# Patient Record
Sex: Female | Born: 1945 | Race: Black or African American | Hispanic: No | Marital: Single
Health system: Southern US, Community
[De-identification: ages and names within clinical notes are randomized; demographics above are authoritative.]

---

## 2004-07-12 ENCOUNTER — Emergency Department: Payer: Self-pay | Admitting: Emergency Medicine

## 2010-07-23 ENCOUNTER — Ambulatory Visit: Payer: Self-pay | Admitting: Oncology

## 2010-08-19 ENCOUNTER — Ambulatory Visit: Payer: Self-pay | Admitting: Oncology

## 2010-09-18 ENCOUNTER — Ambulatory Visit: Payer: Self-pay | Admitting: Oncology

## 2010-10-19 ENCOUNTER — Ambulatory Visit: Payer: Self-pay | Admitting: Oncology

## 2010-10-29 ENCOUNTER — Ambulatory Visit: Payer: Self-pay | Admitting: Internal Medicine

## 2010-11-13 ENCOUNTER — Ambulatory Visit: Payer: Self-pay | Admitting: Oncology

## 2010-11-18 ENCOUNTER — Ambulatory Visit: Payer: Self-pay | Admitting: Oncology

## 2010-12-19 ENCOUNTER — Ambulatory Visit: Payer: Self-pay | Admitting: Oncology

## 2011-01-19 ENCOUNTER — Ambulatory Visit: Payer: Self-pay | Admitting: Oncology

## 2011-02-18 ENCOUNTER — Ambulatory Visit: Payer: Self-pay | Admitting: Oncology

## 2011-03-21 ENCOUNTER — Ambulatory Visit: Payer: Self-pay | Admitting: Oncology

## 2011-04-20 ENCOUNTER — Ambulatory Visit: Payer: Self-pay | Admitting: Oncology

## 2011-06-25 ENCOUNTER — Ambulatory Visit: Payer: Self-pay | Admitting: Internal Medicine

## 2011-08-01 ENCOUNTER — Ambulatory Visit: Payer: Self-pay | Admitting: Oncology

## 2011-08-01 LAB — CBC CANCER CENTER
Basophil %: 0 %
Comment - H1-Com2: NORMAL
Eosinophil %: 1.3 %
HCT: 38.2 % (ref 35.0–47.0)
HGB: 12.5 g/dL (ref 12.0–16.0)
MCH: 31.2 pg (ref 26.0–34.0)
MCV: 95 fL (ref 80–100)
Monocyte #: 0.7 x10 3/mm (ref 0.0–0.7)
Neutrophil %: 48.9 %
RBC: 4.01 10*6/uL (ref 3.80–5.20)
Segmented Neutrophils: 50 %

## 2011-08-01 LAB — BASIC METABOLIC PANEL
Anion Gap: 11 (ref 7–16)
BUN: 44 mg/dL — ABNORMAL HIGH (ref 7–18)
Calcium, Total: 8.9 mg/dL (ref 8.5–10.1)
Chloride: 106 mmol/L (ref 98–107)
EGFR (African American): 24 — ABNORMAL LOW
Glucose: 86 mg/dL (ref 65–99)
Osmolality: 295 (ref 275–301)
Potassium: 4.4 mmol/L (ref 3.5–5.1)

## 2011-08-05 LAB — KAPPA/LAMBDA FREE LIGHT CHAINS (ARMC)

## 2011-08-05 LAB — PROT IMMUNOELECTROPHORES(ARMC)

## 2011-08-19 ENCOUNTER — Ambulatory Visit: Payer: Self-pay | Admitting: Oncology

## 2011-10-01 ENCOUNTER — Ambulatory Visit: Payer: Self-pay | Admitting: Oncology

## 2011-10-01 LAB — CBC CANCER CENTER
Basophil #: 0 x10 3/mm (ref 0.0–0.1)
HGB: 12.1 g/dL (ref 12.0–16.0)
Lymphocyte #: 1.5 x10 3/mm (ref 1.0–3.6)
Lymphocyte %: 36.6 %
MCH: 30.5 pg (ref 26.0–34.0)
MCV: 98 fL (ref 80–100)
Monocyte #: 0.5 x10 3/mm (ref 0.2–0.9)
Monocyte %: 11.3 %
Platelet: 170 x10 3/mm (ref 150–440)
RBC: 3.96 10*6/uL (ref 3.80–5.20)
WBC: 4.1 x10 3/mm (ref 3.6–11.0)

## 2011-10-01 LAB — BASIC METABOLIC PANEL
Anion Gap: 11 (ref 7–16)
BUN: 51 mg/dL — ABNORMAL HIGH (ref 7–18)
Calcium, Total: 9.2 mg/dL (ref 8.5–10.1)
Chloride: 106 mmol/L (ref 98–107)
Creatinine: 2.35 mg/dL — ABNORMAL HIGH (ref 0.60–1.30)
EGFR (Non-African Amer.): 21 — ABNORMAL LOW
Glucose: 100 mg/dL — ABNORMAL HIGH (ref 65–99)
Osmolality: 297 (ref 275–301)
Potassium: 4.5 mmol/L (ref 3.5–5.1)
Sodium: 142 mmol/L (ref 136–145)

## 2011-10-19 ENCOUNTER — Ambulatory Visit: Payer: Self-pay | Admitting: Oncology

## 2011-11-25 ENCOUNTER — Ambulatory Visit: Payer: Self-pay | Admitting: Vascular Surgery

## 2011-11-25 LAB — BASIC METABOLIC PANEL
Anion Gap: 11 (ref 7–16)
BUN: 47 mg/dL — ABNORMAL HIGH (ref 7–18)
Co2: 23 mmol/L (ref 21–32)
Creatinine: 2.38 mg/dL — ABNORMAL HIGH (ref 0.60–1.30)
Glucose: 90 mg/dL (ref 65–99)
Sodium: 144 mmol/L (ref 136–145)

## 2011-11-25 LAB — CBC
HCT: 35.9 % (ref 35.0–47.0)
HGB: 11.2 g/dL — ABNORMAL LOW (ref 12.0–16.0)
WBC: 3.4 10*3/uL — ABNORMAL LOW (ref 3.6–11.0)

## 2011-12-04 ENCOUNTER — Ambulatory Visit: Payer: Self-pay | Admitting: Vascular Surgery

## 2012-01-16 ENCOUNTER — Ambulatory Visit: Payer: Self-pay | Admitting: Oncology

## 2012-01-16 LAB — CBC CANCER CENTER
Basophil #: 0 x10 3/mm (ref 0.0–0.1)
Eosinophil #: 0.1 x10 3/mm (ref 0.0–0.7)
HCT: 35.9 % (ref 35.0–47.0)
HGB: 11.2 g/dL — ABNORMAL LOW (ref 12.0–16.0)
Lymphocyte #: 1.9 x10 3/mm (ref 1.0–3.6)
Lymphocyte %: 36.5 %
Monocyte %: 10.9 %
Neutrophil #: 2.7 x10 3/mm (ref 1.4–6.5)
Neutrophil %: 51.3 %
RBC: 3.66 10*6/uL — ABNORMAL LOW (ref 3.80–5.20)
WBC: 5.2 x10 3/mm (ref 3.6–11.0)

## 2012-01-16 LAB — BASIC METABOLIC PANEL
Anion Gap: 12 (ref 7–16)
BUN: 56 mg/dL — ABNORMAL HIGH (ref 7–18)
EGFR (Non-African Amer.): 15 — ABNORMAL LOW
Glucose: 119 mg/dL — ABNORMAL HIGH (ref 65–99)
Osmolality: 305 (ref 275–301)
Potassium: 4.6 mmol/L (ref 3.5–5.1)
Sodium: 145 mmol/L (ref 136–145)

## 2012-01-17 LAB — PROT IMMUNOELECTROPHORES(ARMC)

## 2012-01-17 LAB — KAPPA/LAMBDA FREE LIGHT CHAINS (ARMC)

## 2012-01-19 ENCOUNTER — Ambulatory Visit: Payer: Self-pay | Admitting: Oncology

## 2012-03-16 LAB — URINALYSIS, COMPLETE
Bilirubin,UR: NEGATIVE
Glucose,UR: NEGATIVE mg/dL
Ketone: NEGATIVE
Leukocyte Esterase: NEGATIVE
Nitrite: NEGATIVE
Ph: 5
Protein: NEGATIVE
RBC,UR: 3 /HPF
Specific Gravity: 1.008
Squamous Epithelial: <1
WBC UR: 1 /HPF

## 2012-03-16 LAB — LIPASE, BLOOD: Lipase: 351 U/L (ref 73–393)

## 2012-03-17 ENCOUNTER — Ambulatory Visit: Payer: Self-pay | Admitting: Oncology

## 2012-03-17 LAB — CBC WITH DIFFERENTIAL/PLATELET
Basophil #: 0 10*3/uL (ref 0.0–0.1)
Basophil %: 0.3 %
Eosinophil #: 0 10*3/uL (ref 0.0–0.7)
HGB: 8.2 g/dL — ABNORMAL LOW (ref 12.0–16.0)
MCH: 30.8 pg (ref 26.0–34.0)
MCHC: 32.1 g/dL (ref 32.0–36.0)
MCV: 96 fL (ref 80–100)
Monocyte #: 0.7 x10 3/mm (ref 0.2–0.9)
Monocyte %: 12 %
Neutrophil %: 56.4 %
Platelet: 83 10*3/uL — ABNORMAL LOW (ref 150–440)

## 2012-03-17 LAB — COMPREHENSIVE METABOLIC PANEL
Albumin: 2.6 g/dL — ABNORMAL LOW (ref 3.4–5.0)
Alkaline Phosphatase: 40 U/L — ABNORMAL LOW (ref 50–136)
Chloride: 108 mmol/L — ABNORMAL HIGH (ref 98–107)
Creatinine: 9.13 mg/dL — ABNORMAL HIGH (ref 0.60–1.30)
EGFR (African American): 5 — ABNORMAL LOW
Potassium: 5.1 mmol/L (ref 3.5–5.1)
SGOT(AST): 14 U/L — ABNORMAL LOW (ref 15–37)
SGPT (ALT): 10 U/L — ABNORMAL LOW (ref 12–78)
Sodium: 140 mmol/L (ref 136–145)
Total Protein: 8.7 g/dL — ABNORMAL HIGH (ref 6.4–8.2)

## 2012-03-17 LAB — PHOSPHORUS: Phosphorus: 4.7 mg/dL (ref 2.5–4.9)

## 2012-03-17 LAB — MAGNESIUM: Magnesium: 1.9 mg/dL

## 2012-03-18 LAB — IRON AND TIBC
Iron: 50 ug/dL (ref 50–170)
Unbound Iron-Bind.Cap.: 92 ug/dL

## 2012-03-18 LAB — BASIC METABOLIC PANEL
Anion Gap: 13 (ref 7–16)
BUN: 77 mg/dL — ABNORMAL HIGH (ref 7–18)
Chloride: 105 mmol/L (ref 98–107)
Creatinine: 7.01 mg/dL — ABNORMAL HIGH (ref 0.60–1.30)
EGFR (African American): 6 — ABNORMAL LOW
EGFR (Non-African Amer.): 6 — ABNORMAL LOW
Osmolality: 303 (ref 275–301)

## 2012-03-19 ENCOUNTER — Inpatient Hospital Stay: Payer: Self-pay | Admitting: Internal Medicine

## 2012-03-19 LAB — PHOSPHORUS: Phosphorus: 3.3 mg/dL (ref 2.5–4.9)

## 2012-03-20 ENCOUNTER — Ambulatory Visit: Payer: Self-pay | Admitting: Oncology

## 2012-03-21 LAB — PHOSPHORUS: Phosphorus: 3.7 mg/dL (ref 2.5–4.9)

## 2012-03-23 ENCOUNTER — Ambulatory Visit: Payer: Self-pay | Admitting: Oncology

## 2012-03-23 LAB — CBC CANCER CENTER
Basophil #: 0 x10 3/mm (ref 0.0–0.1)
Lymphocyte #: 1.5 x10 3/mm (ref 1.0–3.6)
Lymphocyte %: 26.6 %
MCV: 99 fL (ref 80–100)
Monocyte #: 0.5 x10 3/mm (ref 0.2–0.9)
Monocyte %: 8.1 %
Neutrophil #: 3.5 x10 3/mm (ref 1.4–6.5)
Neutrophil %: 63.6 %
Platelet: 57 x10 3/mm — ABNORMAL LOW (ref 150–440)
RDW: 15.7 % — ABNORMAL HIGH (ref 11.5–14.5)
WBC: 5.6 x10 3/mm (ref 3.6–11.0)

## 2012-03-28 ENCOUNTER — Emergency Department: Payer: Self-pay | Admitting: Emergency Medicine

## 2012-03-28 LAB — CBC WITH DIFFERENTIAL/PLATELET
Basophil #: 0 10*3/uL (ref 0.0–0.1)
Basophil %: 0.4 %
Eosinophil #: 0 10*3/uL (ref 0.0–0.7)
HCT: 23 % — ABNORMAL LOW (ref 35.0–47.0)
Lymphocyte #: 0.7 10*3/uL — ABNORMAL LOW (ref 1.0–3.6)
MCHC: 32.8 g/dL (ref 32.0–36.0)
MCV: 97 fL (ref 80–100)
Monocyte #: 0.7 x10 3/mm (ref 0.2–0.9)
Monocyte %: 15.4 %
Neutrophil #: 3.1 10*3/uL (ref 1.4–6.5)
Platelet: 43 10*3/uL — ABNORMAL LOW (ref 150–440)
RBC: 2.36 10*6/uL — ABNORMAL LOW (ref 3.80–5.20)
RDW: 15.6 % — ABNORMAL HIGH (ref 11.5–14.5)
WBC: 4.5 10*3/uL (ref 3.6–11.0)

## 2012-03-28 LAB — COMPREHENSIVE METABOLIC PANEL
Albumin: 2.6 g/dL — ABNORMAL LOW (ref 3.4–5.0)
Anion Gap: 11 (ref 7–16)
BUN: 19 mg/dL — ABNORMAL HIGH (ref 7–18)
Bilirubin,Total: 0.5 mg/dL (ref 0.2–1.0)
Chloride: 98 mmol/L (ref 98–107)
Co2: 27 mmol/L (ref 21–32)
Creatinine: 4.48 mg/dL — ABNORMAL HIGH (ref 0.60–1.30)
EGFR (African American): 11 — ABNORMAL LOW
EGFR (Non-African Amer.): 10 — ABNORMAL LOW
Osmolality: 275 (ref 275–301)
Potassium: 3 mmol/L — ABNORMAL LOW (ref 3.5–5.1)
SGPT (ALT): 12 U/L (ref 12–78)
Sodium: 136 mmol/L (ref 136–145)
Total Protein: 8.7 g/dL — ABNORMAL HIGH (ref 6.4–8.2)

## 2012-03-30 LAB — CBC CANCER CENTER
Basophil %: 0.8 %
Eosinophil #: 0 x10 3/mm (ref 0.0–0.7)
HCT: 23.8 % — ABNORMAL LOW (ref 35.0–47.0)
HGB: 7.3 g/dL — ABNORMAL LOW (ref 12.0–16.0)
Lymphocyte #: 1.2 x10 3/mm (ref 1.0–3.6)
Lymphocyte %: 27.7 %
MCH: 30.5 pg (ref 26.0–34.0)
Monocyte #: 0.3 x10 3/mm (ref 0.2–0.9)
Monocyte %: 7 %
Neutrophil %: 64.1 %
Platelet: 44 x10 3/mm — ABNORMAL LOW (ref 150–440)
RDW: 15.8 % — ABNORMAL HIGH (ref 11.5–14.5)
WBC: 4.5 x10 3/mm (ref 3.6–11.0)

## 2012-04-02 ENCOUNTER — Emergency Department: Payer: Self-pay | Admitting: Emergency Medicine

## 2012-04-02 LAB — CBC
HCT: 25.3 % — ABNORMAL LOW (ref 35.0–47.0)
HGB: 8.2 g/dL — ABNORMAL LOW (ref 12.0–16.0)
MCH: 31.7 pg (ref 26.0–34.0)
MCHC: 32.6 g/dL (ref 32.0–36.0)
Platelet: 55 10*3/uL — ABNORMAL LOW (ref 150–440)

## 2012-04-02 LAB — COMPREHENSIVE METABOLIC PANEL
Albumin: 2.6 g/dL — ABNORMAL LOW (ref 3.4–5.0)
BUN: 64 mg/dL — ABNORMAL HIGH (ref 7–18)
Bilirubin,Total: 0.3 mg/dL (ref 0.2–1.0)
Calcium, Total: 8.8 mg/dL (ref 8.5–10.1)
Chloride: 96 mmol/L — ABNORMAL LOW (ref 98–107)
Creatinine: 8.98 mg/dL — ABNORMAL HIGH (ref 0.60–1.30)
EGFR (African American): 5 — ABNORMAL LOW
Glucose: 76 mg/dL (ref 65–99)
Potassium: 3.5 mmol/L (ref 3.5–5.1)
SGOT(AST): 25 U/L (ref 15–37)
SGPT (ALT): 12 U/L (ref 12–78)
Total Protein: 8.4 g/dL — ABNORMAL HIGH (ref 6.4–8.2)

## 2012-04-03 LAB — CULTURE, BLOOD (SINGLE)

## 2012-04-13 LAB — COMPREHENSIVE METABOLIC PANEL
Albumin: 2.6 g/dL — ABNORMAL LOW (ref 3.4–5.0)
Alkaline Phosphatase: 69 U/L (ref 50–136)
BUN: 12 mg/dL (ref 7–18)
Bilirubin,Total: 0.4 mg/dL (ref 0.2–1.0)
Calcium, Total: 8.7 mg/dL (ref 8.5–10.1)
Chloride: 97 mmol/L — ABNORMAL LOW (ref 98–107)
Osmolality: 283 (ref 275–301)
Potassium: 3.6 mmol/L (ref 3.5–5.1)
SGPT (ALT): 22 U/L (ref 12–78)
Total Protein: 7.7 g/dL (ref 6.4–8.2)

## 2012-04-13 LAB — CBC CANCER CENTER
Basophil #: 0 x10 3/mm (ref 0.0–0.1)
Basophil %: 0.4 %
Eosinophil #: 0 x10 3/mm (ref 0.0–0.7)
HCT: 21.6 % — ABNORMAL LOW (ref 35.0–47.0)
Lymphocyte #: 1.2 x10 3/mm (ref 1.0–3.6)
Lymphocyte %: 20.2 %
MCHC: 31.1 g/dL — ABNORMAL LOW (ref 32.0–36.0)
Neutrophil #: 4 x10 3/mm (ref 1.4–6.5)
Neutrophil %: 69.1 %
RDW: 17.2 % — ABNORMAL HIGH (ref 11.5–14.5)

## 2012-04-19 ENCOUNTER — Ambulatory Visit: Payer: Self-pay | Admitting: Oncology

## 2012-04-30 LAB — BASIC METABOLIC PANEL
Anion Gap: 7 (ref 7–16)
BUN: 15 mg/dL (ref 7–18)
Calcium, Total: 8.5 mg/dL (ref 8.5–10.1)
Creatinine: 5.14 mg/dL — ABNORMAL HIGH (ref 0.60–1.30)
EGFR (African American): 9 — ABNORMAL LOW
EGFR (Non-African Amer.): 8 — ABNORMAL LOW
Glucose: 86 mg/dL (ref 65–99)
Sodium: 139 mmol/L (ref 136–145)

## 2012-04-30 LAB — CBC CANCER CENTER
Basophil #: 0 x10 3/mm (ref 0.0–0.1)
Basophil %: 0.5 %
HCT: 23.5 % — ABNORMAL LOW (ref 35.0–47.0)
HGB: 7.3 g/dL — ABNORMAL LOW (ref 12.0–16.0)
Lymphocyte %: 19.3 %
Monocyte %: 18 %
Neutrophil #: 2.8 x10 3/mm (ref 1.4–6.5)
Neutrophil %: 61.5 %
RBC: 2.3 10*6/uL — ABNORMAL LOW (ref 3.80–5.20)
WBC: 4.6 x10 3/mm (ref 3.6–11.0)

## 2012-05-02 LAB — PROT IMMUNOELECTROPHORES(ARMC)

## 2012-05-04 LAB — BASIC METABOLIC PANEL
Anion Gap: 20 — ABNORMAL HIGH (ref 7–16)
Calcium, Total: 8.1 mg/dL — ABNORMAL LOW (ref 8.5–10.1)
Chloride: 97 mmol/L — ABNORMAL LOW (ref 98–107)
Co2: 23 mmol/L (ref 21–32)
EGFR (Non-African Amer.): 3 — ABNORMAL LOW
Potassium: 3.3 mmol/L — ABNORMAL LOW (ref 3.5–5.1)
Sodium: 140 mmol/L (ref 136–145)

## 2012-05-04 LAB — CBC CANCER CENTER
Basophil #: 0 x10 3/mm (ref 0.0–0.1)
Basophil %: 0.3 %
HGB: 9 g/dL — ABNORMAL LOW (ref 12.0–16.0)
Lymphocyte %: 16.1 %
MCH: 31.9 pg (ref 26.0–34.0)
MCHC: 32.3 g/dL (ref 32.0–36.0)
Monocyte %: 13.3 %
Neutrophil #: 5.1 x10 3/mm (ref 1.4–6.5)
Neutrophil %: 69.8 %
Platelet: 134 x10 3/mm — ABNORMAL LOW (ref 150–440)
WBC: 7.3 x10 3/mm (ref 3.6–11.0)

## 2012-05-20 ENCOUNTER — Ambulatory Visit: Payer: Self-pay | Admitting: Oncology

## 2012-05-25 LAB — CBC CANCER CENTER
Basophil #: 0.1 x10 3/mm (ref 0.0–0.1)
Basophil %: 2 %
HCT: 32.1 % — ABNORMAL LOW (ref 35.0–47.0)
HGB: 10 g/dL — ABNORMAL LOW (ref 12.0–16.0)
Lymphocyte %: 18.8 %
MCHC: 31.3 g/dL — ABNORMAL LOW (ref 32.0–36.0)
MCV: 100 fL (ref 80–100)
Monocyte #: 0.4 x10 3/mm (ref 0.2–0.9)
Monocyte %: 9.5 %
Neutrophil %: 68.8 %
Platelet: 98 x10 3/mm — ABNORMAL LOW (ref 150–440)
RBC: 3.2 10*6/uL — ABNORMAL LOW (ref 3.80–5.20)
RDW: 21.6 % — ABNORMAL HIGH (ref 11.5–14.5)

## 2012-05-25 LAB — BASIC METABOLIC PANEL
Anion Gap: 12 (ref 7–16)
BUN: 50 mg/dL — ABNORMAL HIGH (ref 7–18)
Calcium, Total: 7.5 mg/dL — ABNORMAL LOW (ref 8.5–10.1)
Chloride: 103 mmol/L (ref 98–107)
Co2: 31 mmol/L (ref 21–32)
Creatinine: 8.17 mg/dL — ABNORMAL HIGH (ref 0.60–1.30)
Glucose: 144 mg/dL — ABNORMAL HIGH (ref 65–99)
Osmolality: 306 (ref 275–301)
Potassium: 3.9 mmol/L (ref 3.5–5.1)

## 2012-06-15 LAB — CBC CANCER CENTER
Basophil #: 0 x10 3/mm (ref 0.0–0.1)
Eosinophil #: 0 x10 3/mm (ref 0.0–0.7)
Eosinophil %: 0 %
Lymphocyte %: 12.5 %
MCHC: 30.9 g/dL — ABNORMAL LOW (ref 32.0–36.0)
MCV: 100 fL (ref 80–100)
Neutrophil #: 5 x10 3/mm (ref 1.4–6.5)
Platelet: 97 x10 3/mm — ABNORMAL LOW (ref 150–440)

## 2012-06-15 LAB — BASIC METABOLIC PANEL
BUN: 47 mg/dL — ABNORMAL HIGH (ref 7–18)
Chloride: 101 mmol/L (ref 98–107)
Creatinine: 7.81 mg/dL — ABNORMAL HIGH (ref 0.60–1.30)
EGFR (Non-African Amer.): 5 — ABNORMAL LOW
Osmolality: 303 (ref 275–301)
Potassium: 3.5 mmol/L (ref 3.5–5.1)
Sodium: 145 mmol/L (ref 136–145)

## 2012-06-16 LAB — PROT IMMUNOELECTROPHORES(ARMC)

## 2012-06-20 ENCOUNTER — Ambulatory Visit: Payer: Self-pay | Admitting: Oncology

## 2012-06-21 ENCOUNTER — Inpatient Hospital Stay: Payer: Self-pay | Admitting: Internal Medicine

## 2012-06-21 LAB — CK TOTAL AND CKMB (NOT AT ARMC)
CK, Total: 75 U/L (ref 21–215)
CK, Total: 62 U/L (ref 21–215)
CK-MB: 1.1 ng/mL (ref 0.5–3.6)
CK-MB: 1.1 ng/mL (ref 0.5–3.6)

## 2012-06-21 LAB — COMPREHENSIVE METABOLIC PANEL
Alkaline Phosphatase: 104 U/L (ref 50–136)
Anion Gap: 14 (ref 7–16)
BUN: 29 mg/dL — ABNORMAL HIGH (ref 7–18)
Chloride: 98 mmol/L (ref 98–107)
Co2: 26 mmol/L (ref 21–32)
Creatinine: 5.52 mg/dL — ABNORMAL HIGH (ref 0.60–1.30)
EGFR (Non-African Amer.): 7 — ABNORMAL LOW
Osmolality: 281 (ref 275–301)
Potassium: 3.3 mmol/L — ABNORMAL LOW (ref 3.5–5.1)
SGOT(AST): 17 U/L (ref 15–37)
SGPT (ALT): 10 U/L — ABNORMAL LOW (ref 12–78)
Sodium: 138 mmol/L (ref 136–145)
Total Protein: 7 g/dL (ref 6.4–8.2)

## 2012-06-21 LAB — CBC WITH DIFFERENTIAL/PLATELET
Basophil #: 0 10*3/uL (ref 0.0–0.1)
Eosinophil #: 0 10*3/uL (ref 0.0–0.7)
HCT: 39.3 % (ref 35.0–47.0)
Lymphocyte #: 0.8 10*3/uL — ABNORMAL LOW (ref 1.0–3.6)
MCH: 31.2 pg (ref 26.0–34.0)
MCHC: 31 g/dL — ABNORMAL LOW (ref 32.0–36.0)
MCV: 101 fL — ABNORMAL HIGH (ref 80–100)
Monocyte #: 1.1 x10 3/mm — ABNORMAL HIGH (ref 0.2–0.9)
Monocyte %: 12.4 %
Neutrophil #: 6.7 10*3/uL — ABNORMAL HIGH (ref 1.4–6.5)
Neutrophil %: 77.3 %
Platelet: 74 10*3/uL — ABNORMAL LOW (ref 150–440)
RBC: 3.9 10*6/uL (ref 3.80–5.20)
RDW: 20.4 % — ABNORMAL HIGH (ref 11.5–14.5)

## 2012-06-21 LAB — RAPID INFLUENZA A&B ANTIGENS

## 2012-06-21 LAB — TROPONIN I: Troponin-I: 0.11 ng/mL — ABNORMAL HIGH

## 2012-06-22 LAB — CBC WITH DIFFERENTIAL/PLATELET
Basophil #: 0 10*3/uL (ref 0.0–0.1)
Basophil %: 0.2 %
Eosinophil #: 0 10*3/uL (ref 0.0–0.7)
Eosinophil %: 0 %
HCT: 37.4 % (ref 35.0–47.0)
Lymphocyte #: 0.4 10*3/uL — ABNORMAL LOW (ref 1.0–3.6)
Lymphocyte %: 5 %
MCHC: 31.3 g/dL — ABNORMAL LOW (ref 32.0–36.0)
Monocyte #: 0.3 x10 3/mm (ref 0.2–0.9)
Monocyte %: 3.5 %
Neutrophil #: 8.1 10*3/uL — ABNORMAL HIGH (ref 1.4–6.5)
Neutrophil %: 91.3 %
Platelet: 83 10*3/uL — ABNORMAL LOW (ref 150–440)
RDW: 20.5 % — ABNORMAL HIGH (ref 11.5–14.5)
WBC: 8.9 10*3/uL (ref 3.6–11.0)

## 2012-06-22 LAB — BASIC METABOLIC PANEL
Anion Gap: 15 (ref 7–16)
Calcium, Total: 7.2 mg/dL — ABNORMAL LOW (ref 8.5–10.1)
Chloride: 98 mmol/L (ref 98–107)
Co2: 24 mmol/L (ref 21–32)
Creatinine: 7.2 mg/dL — ABNORMAL HIGH (ref 0.60–1.30)
EGFR (African American): 6 — ABNORMAL LOW
Potassium: 3.6 mmol/L (ref 3.5–5.1)
Sodium: 137 mmol/L (ref 136–145)

## 2012-06-25 ENCOUNTER — Ambulatory Visit: Payer: Self-pay | Admitting: Oncology

## 2012-07-18 ENCOUNTER — Ambulatory Visit: Payer: Self-pay | Admitting: Oncology

## 2012-07-23 LAB — BASIC METABOLIC PANEL
BUN: 20 mg/dL — ABNORMAL HIGH (ref 7–18)
Chloride: 99 mmol/L (ref 98–107)
Co2: 33 mmol/L — ABNORMAL HIGH (ref 21–32)
Creatinine: 4.64 mg/dL — ABNORMAL HIGH (ref 0.60–1.30)
Glucose: 108 mg/dL — ABNORMAL HIGH (ref 65–99)
Potassium: 3.3 mmol/L — ABNORMAL LOW (ref 3.5–5.1)

## 2012-07-23 LAB — CBC CANCER CENTER
Eosinophil #: 0 x10 3/mm (ref 0.0–0.7)
HCT: 34.9 % — ABNORMAL LOW (ref 35.0–47.0)
Lymphocyte #: 1 x10 3/mm (ref 1.0–3.6)
MCHC: 31.3 g/dL — ABNORMAL LOW (ref 32.0–36.0)
MCV: 96 fL (ref 80–100)
Monocyte #: 0.8 x10 3/mm (ref 0.2–0.9)
Monocyte %: 17.7 %
Neutrophil #: 2.5 x10 3/mm (ref 1.4–6.5)
Neutrophil %: 57.4 %
Platelet: 101 x10 3/mm — ABNORMAL LOW (ref 150–440)
RBC: 3.65 10*6/uL — ABNORMAL LOW (ref 3.80–5.20)
RDW: 19 % — ABNORMAL HIGH (ref 11.5–14.5)
WBC: 4.3 x10 3/mm (ref 3.6–11.0)

## 2012-08-18 ENCOUNTER — Ambulatory Visit: Payer: Self-pay | Admitting: Oncology

## 2012-09-03 LAB — CBC CANCER CENTER
Basophil #: 0.1 x10 3/mm (ref 0.0–0.1)
Eosinophil #: 0 x10 3/mm (ref 0.0–0.7)
Lymphocyte #: 1.1 x10 3/mm (ref 1.0–3.6)
MCH: 29.8 pg (ref 26.0–34.0)
MCV: 95 fL (ref 80–100)
Monocyte #: 1 x10 3/mm — ABNORMAL HIGH (ref 0.2–0.9)
Monocyte %: 16.2 %
Neutrophil #: 3.7 x10 3/mm (ref 1.4–6.5)
Neutrophil %: 62.8 %
Platelet: 110 x10 3/mm — ABNORMAL LOW (ref 150–440)
RDW: 17.9 % — ABNORMAL HIGH (ref 11.5–14.5)

## 2012-09-03 LAB — BASIC METABOLIC PANEL
Anion Gap: 10 (ref 7–16)
BUN: 8 mg/dL (ref 7–18)
Calcium, Total: 8.9 mg/dL (ref 8.5–10.1)
Chloride: 98 mmol/L (ref 98–107)
Co2: 32 mmol/L (ref 21–32)
EGFR (African American): 17 — ABNORMAL LOW
Glucose: 87 mg/dL (ref 65–99)
Sodium: 140 mmol/L (ref 136–145)

## 2012-09-07 LAB — PROT IMMUNOELECTROPHORES(ARMC)

## 2012-09-14 ENCOUNTER — Ambulatory Visit: Payer: Self-pay | Admitting: Vascular Surgery

## 2012-09-17 ENCOUNTER — Ambulatory Visit: Payer: Self-pay | Admitting: Oncology

## 2012-10-04 LAB — COMPREHENSIVE METABOLIC PANEL
Alkaline Phosphatase: 57 U/L (ref 50–136)
Chloride: 103 mmol/L (ref 98–107)
Co2: 26 mmol/L (ref 21–32)
Creatinine: 9.89 mg/dL — ABNORMAL HIGH (ref 0.60–1.30)
EGFR (African American): 4 — ABNORMAL LOW
EGFR (Non-African Amer.): 4 — ABNORMAL LOW
Glucose: 86 mg/dL (ref 65–99)
Osmolality: 287 (ref 275–301)
SGOT(AST): 30 U/L (ref 15–37)
Total Protein: 9.2 g/dL — ABNORMAL HIGH (ref 6.4–8.2)

## 2012-10-04 LAB — CBC
HGB: 9 g/dL — ABNORMAL LOW (ref 12.0–16.0)
RBC: 2.96 10*6/uL — ABNORMAL LOW (ref 3.80–5.20)
RDW: 17.9 % — ABNORMAL HIGH (ref 11.5–14.5)
WBC: 7.1 10*3/uL (ref 3.6–11.0)

## 2012-10-05 ENCOUNTER — Inpatient Hospital Stay: Payer: Self-pay | Admitting: Internal Medicine

## 2012-10-05 LAB — PHOSPHORUS: Phosphorus: 3.3 mg/dL (ref 2.5–4.9)

## 2012-10-05 LAB — TROPONIN I: Troponin-I: 0.25 ng/mL — ABNORMAL HIGH

## 2012-10-06 LAB — BASIC METABOLIC PANEL
BUN: 22 mg/dL — ABNORMAL HIGH (ref 7–18)
Chloride: 100 mmol/L (ref 98–107)
Creatinine: 6.6 mg/dL — ABNORMAL HIGH (ref 0.60–1.30)
Osmolality: 280 (ref 275–301)
Potassium: 3.7 mmol/L (ref 3.5–5.1)
Sodium: 139 mmol/L (ref 136–145)

## 2012-10-06 LAB — PROTEIN ELECTROPHORESIS(ARMC)

## 2012-10-10 LAB — CULTURE, BLOOD (SINGLE)

## 2012-10-14 ENCOUNTER — Inpatient Hospital Stay: Payer: Self-pay | Admitting: Internal Medicine

## 2012-10-14 LAB — PROTIME-INR
INR: 1.3
Prothrombin Time: 15.8 secs — ABNORMAL HIGH (ref 11.5–14.7)

## 2012-10-14 LAB — CBC
HGB: 8.9 g/dL — ABNORMAL LOW (ref 12.0–16.0)
MCH: 30.5 pg (ref 26.0–34.0)
MCHC: 32.3 g/dL (ref 32.0–36.0)
MCV: 94 fL (ref 80–100)
Platelet: 86 10*3/uL — ABNORMAL LOW (ref 150–440)
RBC: 2.92 10*6/uL — ABNORMAL LOW (ref 3.80–5.20)
WBC: 7.6 10*3/uL (ref 3.6–11.0)

## 2012-10-14 LAB — COMPREHENSIVE METABOLIC PANEL
Albumin: 2.3 g/dL — ABNORMAL LOW (ref 3.4–5.0)
Alkaline Phosphatase: 42 U/L — ABNORMAL LOW (ref 50–136)
Anion Gap: 11 (ref 7–16)
Calcium, Total: 10.2 mg/dL — ABNORMAL HIGH (ref 8.5–10.1)
Chloride: 99 mmol/L (ref 98–107)
Co2: 28 mmol/L (ref 21–32)
Creatinine: 4.68 mg/dL — ABNORMAL HIGH (ref 0.60–1.30)
EGFR (Non-African Amer.): 9 — ABNORMAL LOW
Glucose: 110 mg/dL — ABNORMAL HIGH (ref 65–99)
Osmolality: 276 (ref 275–301)
SGOT(AST): 29 U/L (ref 15–37)
SGPT (ALT): 7 U/L — ABNORMAL LOW (ref 12–78)

## 2012-10-15 LAB — CBC WITH DIFFERENTIAL/PLATELET
Basophil #: 0 10*3/uL (ref 0.0–0.1)
Basophil %: 0.4 %
Eosinophil #: 0 10*3/uL (ref 0.0–0.7)
Eosinophil %: 0.2 %
HCT: 24 % — ABNORMAL LOW (ref 35.0–47.0)
HGB: 8 g/dL — ABNORMAL LOW (ref 12.0–16.0)
Lymphocyte %: 15.6 %
MCHC: 33.3 g/dL (ref 32.0–36.0)
Monocyte %: 21.2 %
Neutrophil %: 62.6 %
Platelet: 77 10*3/uL — ABNORMAL LOW (ref 150–440)
RDW: 17.7 % — ABNORMAL HIGH (ref 11.5–14.5)

## 2012-10-15 LAB — BASIC METABOLIC PANEL
Anion Gap: 9 (ref 7–16)
Calcium, Total: 10.1 mg/dL (ref 8.5–10.1)
Co2: 30 mmol/L (ref 21–32)
Creatinine: 5.88 mg/dL — ABNORMAL HIGH (ref 0.60–1.30)
Glucose: 99 mg/dL (ref 65–99)
Sodium: 135 mmol/L — ABNORMAL LOW (ref 136–145)

## 2012-10-15 LAB — MAGNESIUM: Magnesium: 1.8 mg/dL

## 2012-10-15 LAB — VANCOMYCIN, TROUGH: Vancomycin, Trough: 33 ug/mL (ref 10–20)

## 2012-10-16 LAB — CBC WITH DIFFERENTIAL/PLATELET
Basophil #: 0 10*3/uL (ref 0.0–0.1)
Basophil %: 0.2 %
Eosinophil #: 0 10*3/uL (ref 0.0–0.7)
Eosinophil %: 0.5 %
HCT: 24.7 % — ABNORMAL LOW (ref 35.0–47.0)
HGB: 8.1 g/dL — ABNORMAL LOW (ref 12.0–16.0)
Lymphocyte #: 1 10*3/uL (ref 1.0–3.6)
Lymphocyte %: 15 %
MCH: 31.2 pg (ref 26.0–34.0)
MCHC: 32.8 g/dL (ref 32.0–36.0)
MCV: 95 fL (ref 80–100)
Monocyte #: 1.5 x10 3/mm — ABNORMAL HIGH (ref 0.2–0.9)
Neutrophil #: 4.2 10*3/uL (ref 1.4–6.5)
RBC: 2.6 10*6/uL — ABNORMAL LOW (ref 3.80–5.20)
WBC: 6.8 10*3/uL (ref 3.6–11.0)

## 2012-10-16 LAB — BASIC METABOLIC PANEL
Anion Gap: 7 (ref 7–16)
Calcium, Total: 10.2 mg/dL — ABNORMAL HIGH (ref 8.5–10.1)
EGFR (African American): 10 — ABNORMAL LOW
EGFR (Non-African Amer.): 9 — ABNORMAL LOW
Glucose: 94 mg/dL (ref 65–99)
Osmolality: 277 (ref 275–301)
Potassium: 3.6 mmol/L (ref 3.5–5.1)
Sodium: 138 mmol/L (ref 136–145)

## 2012-10-18 ENCOUNTER — Ambulatory Visit: Payer: Self-pay | Admitting: Oncology

## 2012-10-18 LAB — CBC WITH DIFFERENTIAL/PLATELET
HCT: 27.6 % — ABNORMAL LOW (ref 35.0–47.0)
HGB: 8.7 g/dL — ABNORMAL LOW (ref 12.0–16.0)
Monocyte #: 1.5 x10 3/mm — ABNORMAL HIGH (ref 0.2–0.9)
Monocyte %: 14.9 %
Platelet: 96 10*3/uL — ABNORMAL LOW (ref 150–440)
RBC: 2.83 10*6/uL — ABNORMAL LOW (ref 3.80–5.20)
WBC: 9.9 10*3/uL (ref 3.6–11.0)

## 2012-10-19 LAB — PHOSPHORUS: Phosphorus: 7.7 mg/dL — ABNORMAL HIGH (ref 2.5–4.9)

## 2012-10-19 LAB — PROTIME-INR
INR: 1.2
Prothrombin Time: 15.3 secs — ABNORMAL HIGH (ref 11.5–14.7)

## 2012-10-19 LAB — APTT: Activated PTT: 36.2 secs — ABNORMAL HIGH (ref 23.6–35.9)

## 2012-10-20 LAB — GLUCOSE, SEROUS FLUID: Glucose, Body Fluid: 62 mg/dL

## 2012-10-20 LAB — CULTURE, BLOOD (SINGLE)

## 2012-10-20 LAB — BODY FLUID CELL COUNT WITH DIFFERENTIAL
Basophil: 0 %
Eosinophil: 0 %
Nucleated Cell Count: 4317 /mm3
Other Cells BF: 93 %
Other Mononuclear Cells: 0 %

## 2012-10-20 LAB — PROTEIN, BODY FLUID: Protein, Body Fluid: 6.2 g/dL

## 2012-10-23 LAB — PHOSPHORUS: Phosphorus: 4 mg/dL (ref 2.5–4.9)

## 2012-10-24 ENCOUNTER — Ambulatory Visit: Payer: Self-pay | Admitting: Oncology

## 2012-10-24 LAB — BODY FLUID CULTURE

## 2012-11-04 ENCOUNTER — Observation Stay: Payer: Self-pay | Admitting: Internal Medicine

## 2012-11-04 LAB — BASIC METABOLIC PANEL
Anion Gap: 10 (ref 7–16)
BUN: 12 mg/dL (ref 7–18)
Chloride: 95 mmol/L — ABNORMAL LOW (ref 98–107)
Co2: 30 mmol/L (ref 21–32)
Creatinine: 3.54 mg/dL — ABNORMAL HIGH (ref 0.60–1.30)
EGFR (African American): 15 — ABNORMAL LOW
Glucose: 85 mg/dL (ref 65–99)
Osmolality: 269 (ref 275–301)
Sodium: 135 mmol/L — ABNORMAL LOW (ref 136–145)

## 2012-11-04 LAB — CBC WITH DIFFERENTIAL/PLATELET
Basophil #: 0 10*3/uL (ref 0.0–0.1)
Basophil %: 0.4 %
Eosinophil %: 1 %
HCT: 20.1 % — ABNORMAL LOW (ref 35.0–47.0)
Lymphocyte #: 1.6 10*3/uL (ref 1.0–3.6)
MCHC: 32.7 g/dL (ref 32.0–36.0)
MCV: 94 fL (ref 80–100)
Monocyte #: 1.1 x10 3/mm — ABNORMAL HIGH (ref 0.2–0.9)
Monocyte %: 20.1 %

## 2012-11-17 ENCOUNTER — Ambulatory Visit: Payer: Self-pay | Admitting: Oncology

## 2012-11-17 DEATH — deceased

## 2013-08-28 IMAGING — CR RIGHT HIP - COMPLETE 2+ VIEW
1 series · 2 of 2 positions shown · non-contrast
Comparison: none

REASON FOR EXAM: hip pain
COMMENTS:

PROCEDURE:     DXR - DXR HIP RIGHT COMPLETE  - June 25, 2011  [DATE]
RESULT:     Two views of the right hip were obtained. No fracture,
dislocation or other acute bony abnormality is identified. No lytic or
blastic lesions are seen.

[Series 1: t hip ap right · 0.14mm/px · 2 of 2 slices shown]
[im 1/2]
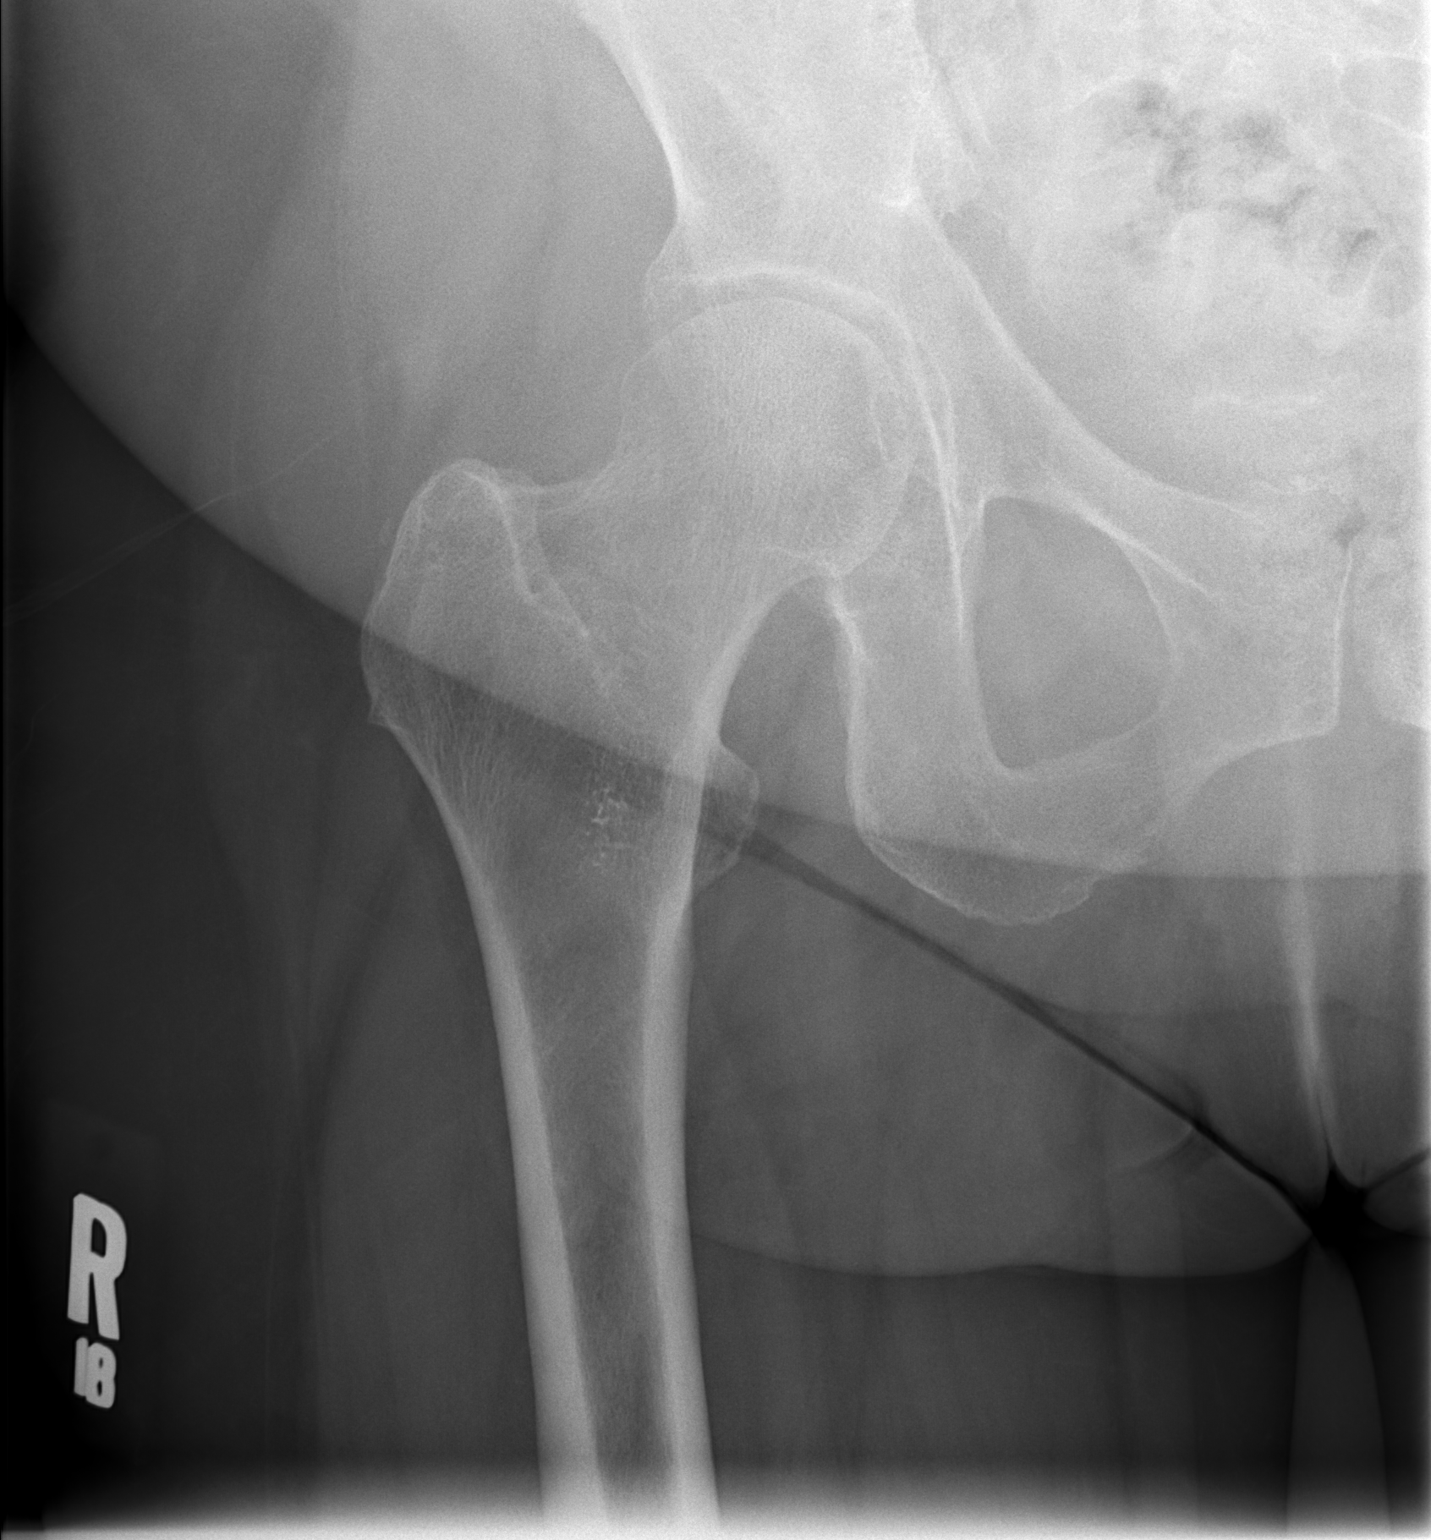
[im 2/2]
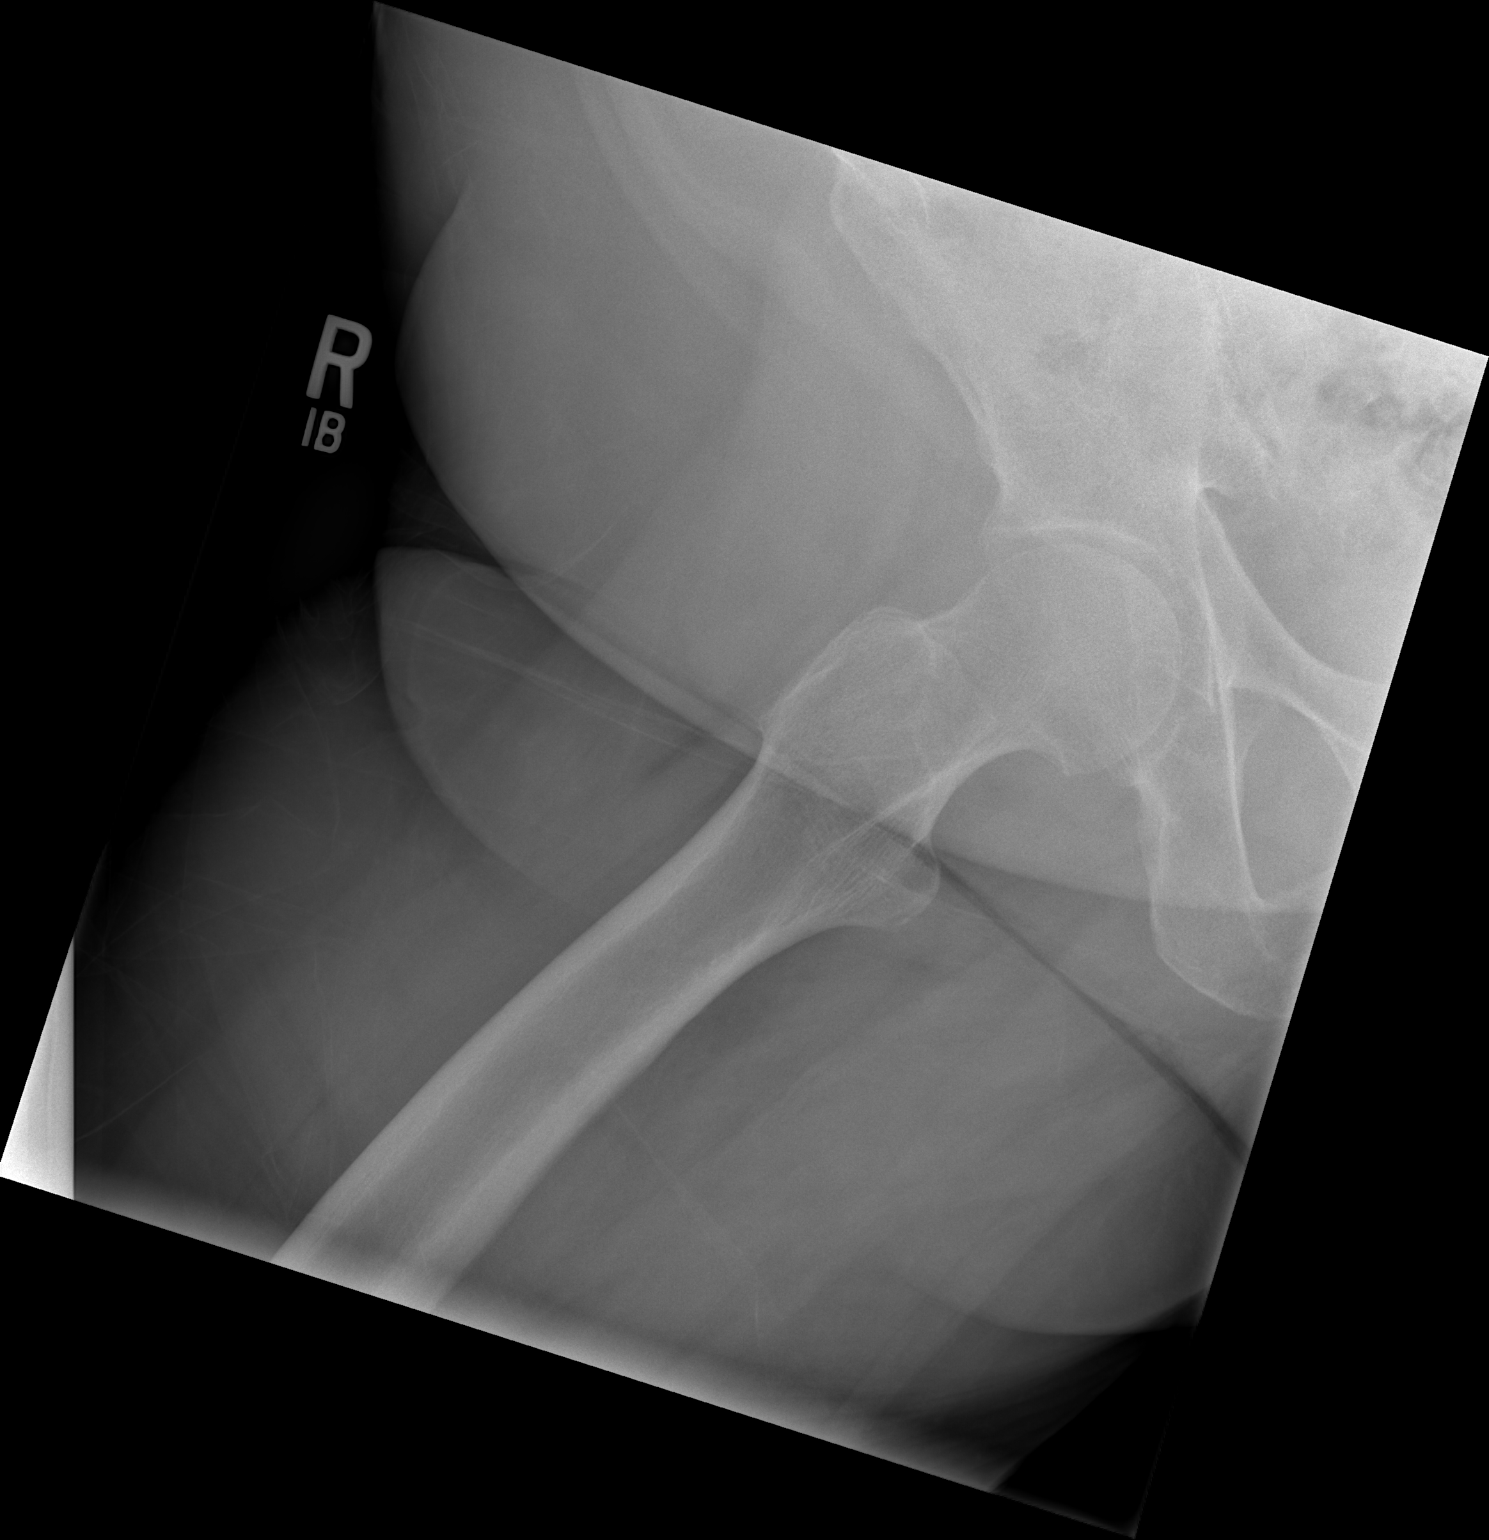

[2 of 2 positions shown; findings below may reference images not displayed]

IMPRESSION: 1.     No acute changes are identified.

## 2014-06-06 IMAGING — CT CT STONE STUDY
1 of 2 series · 15 of 32 positions shown, 19 images · non-contrast
Comparison: None

REASON FOR EXAM: right flank pain
COMMENTS:

PROCEDURE:     CT  - CT ABDOMEN /PELVIS WO (STONE)  - April 02, 2012  [DATE]
RESULT:     Indication: Flank Pain
TECHNIQUE: Multiple axial images from the lung bases to the symphysis pubis
were obtained without oral and without intravenous contrast.

[Series 2: 3mm soft tissue · axial · 0.76mm/px · z∈[-376,-38]mm · 15 of 123 slices shown, 19 images]
[im 5/123  soft-tissue]
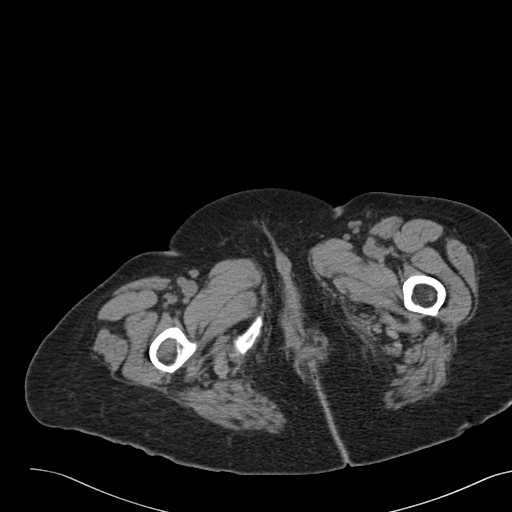
[im 5/123  bone]
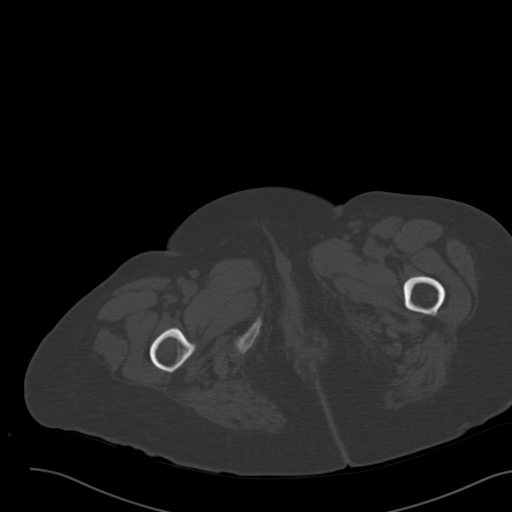
[im 15/123  soft-tissue]
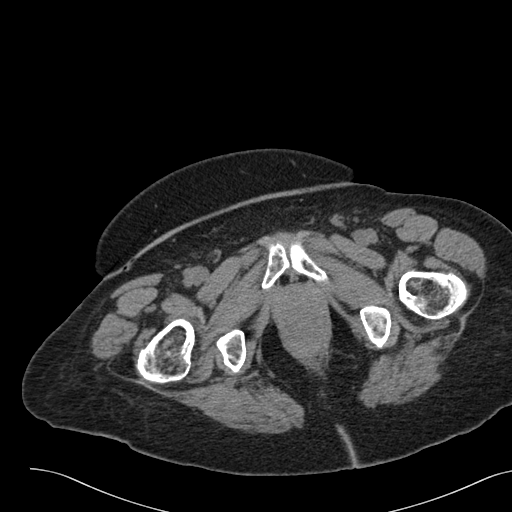
[im 25/123  soft-tissue]
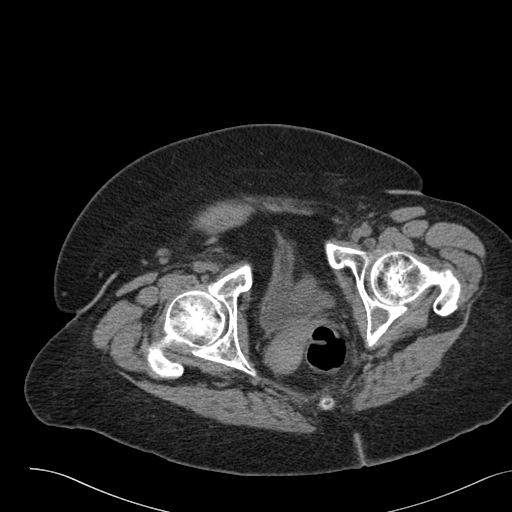
[im 35/123  soft-tissue]
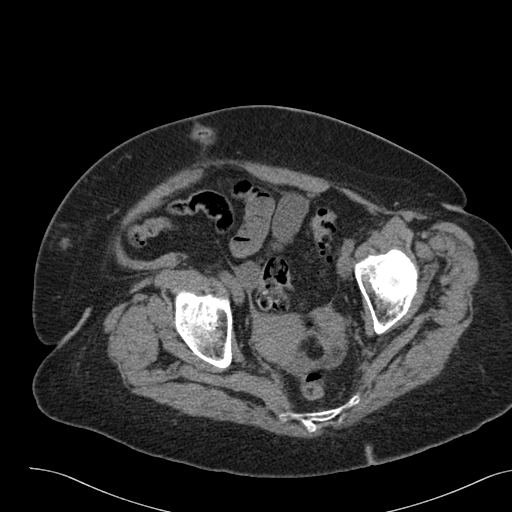
[im 44/123  soft-tissue]
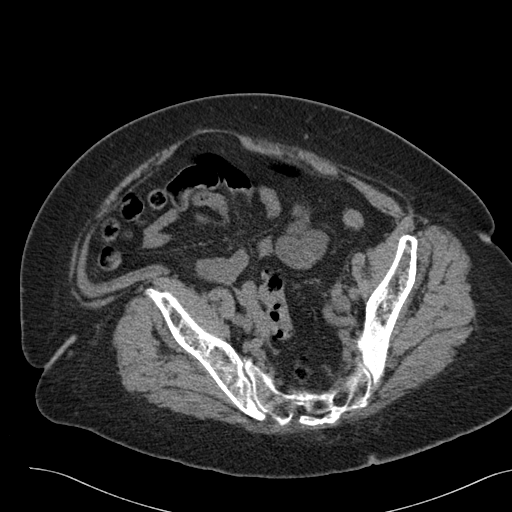
[im 54/123  soft-tissue]
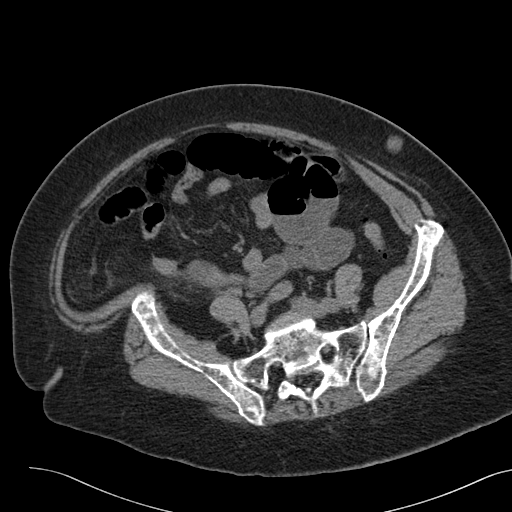
[im 64/123  soft-tissue]
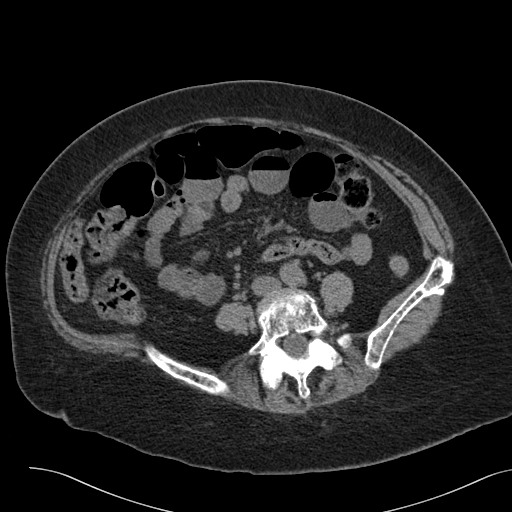
[im 69/123  soft-tissue]
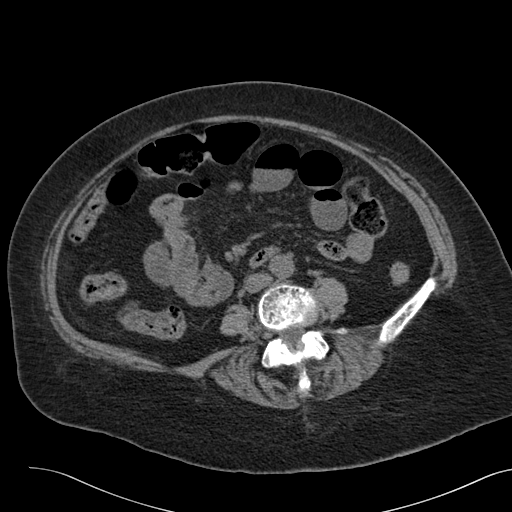
[im 79/123  soft-tissue]
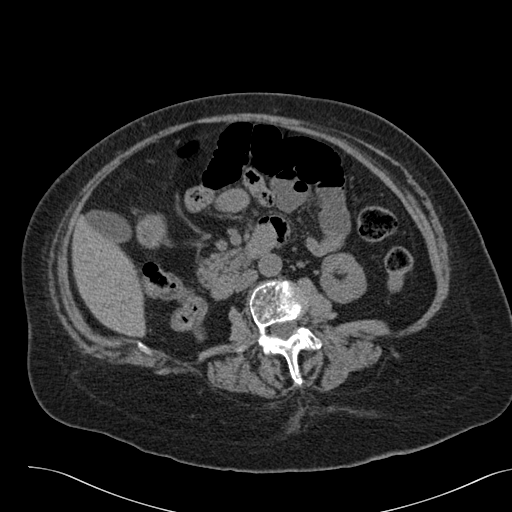
[im 79/123  bone]
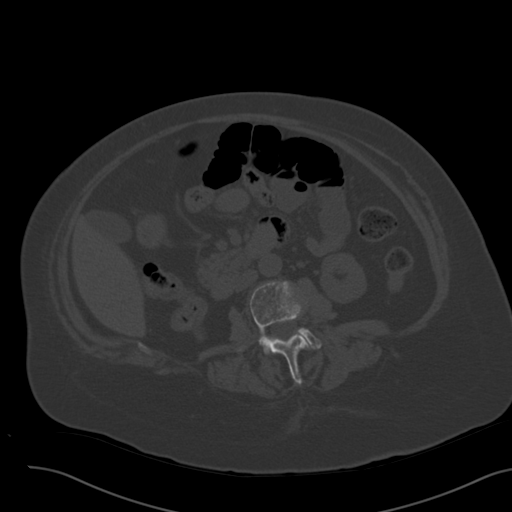
[im 88/123  soft-tissue]
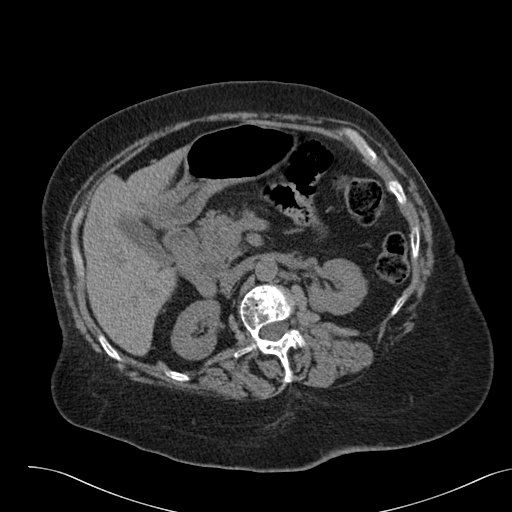
[im 98/123  soft-tissue]
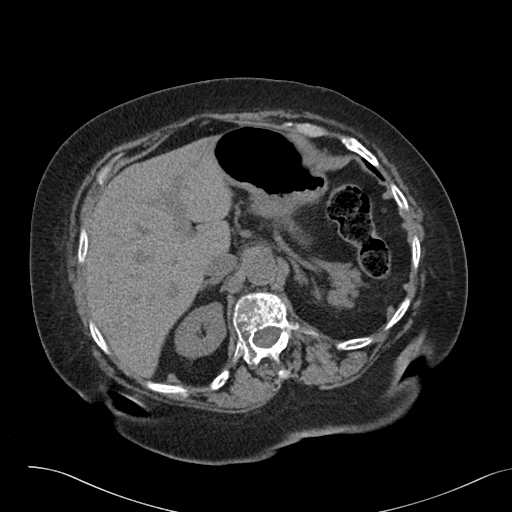
[im 103/123  lung]
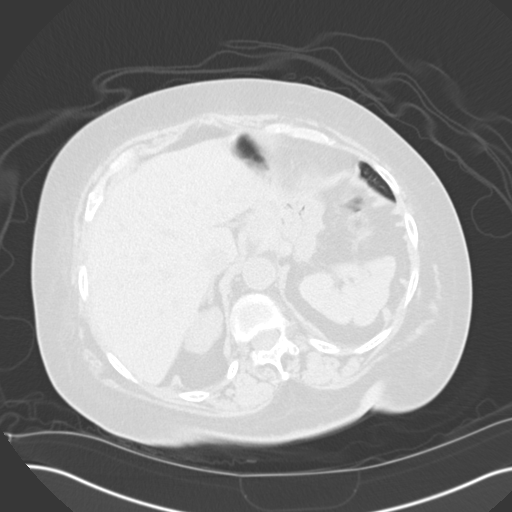
[im 108/123  soft-tissue]
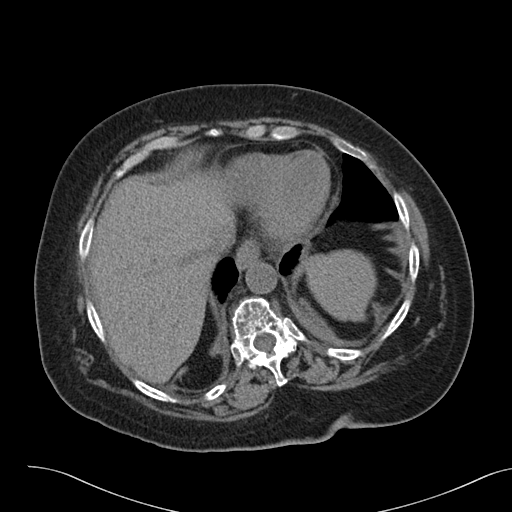
[im 108/123  lung]
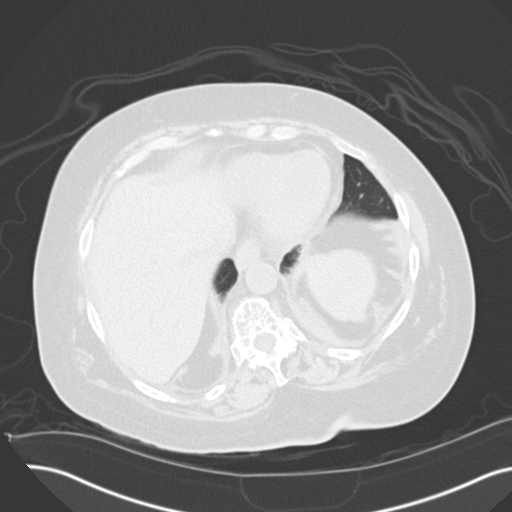
[im 113/123  lung]
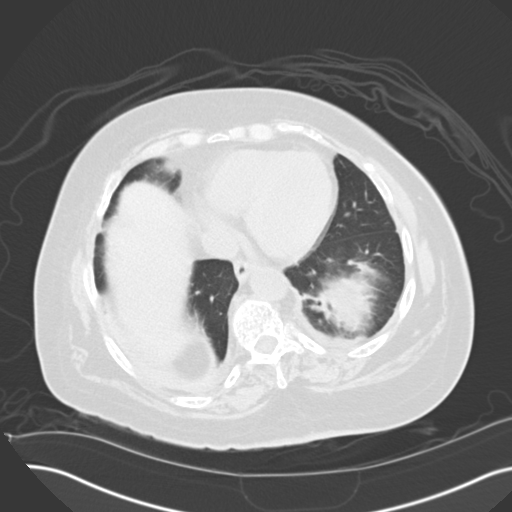
[im 118/123  soft-tissue]
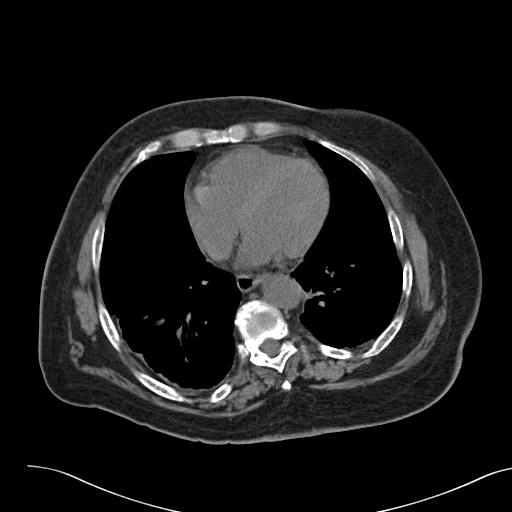
[im 118/123  lung]
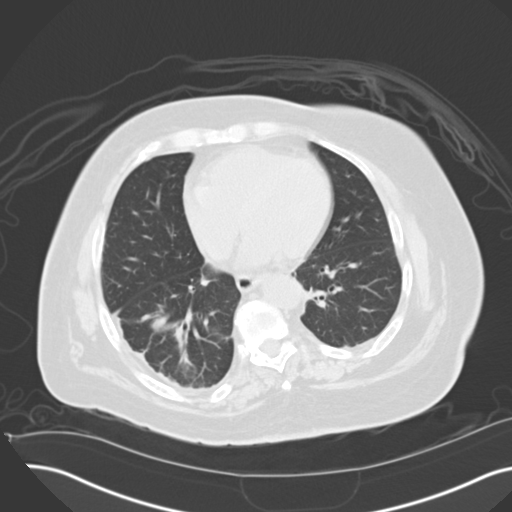

[15 of 32 positions shown; findings below may reference images not displayed]

FINDINGS: The lung bases are clear. There is no pleural or pericardial effusions.

No renal, ureteral, or bladder calculi. No obstructive uropathy. No
perinephric stranding is seen. The kidneys are symmetric in size without
evidence for exophytic mass. The bladder is unremarkable.

The liver demonstrates no focal abnormality. The gallbladder is
unremarkable. The spleen demonstrates no focal abnormality. The adrenal
glands and pancreas are normal.

There is a fat containing umbilical hernia. The unopacified stomach,
duodenum, small intestine, and large intestine are unremarkable, but
evaluation is limited by lack of oral contrast. There is diverticulosis
without evidence of diverticulitis. There is no pneumoperitoneum,
pneumatosis, or portal venous gas. There is no abdominal or pelvic free
fluid. There is no lymphadenopathy.

The abdominal aorta is normal in caliber with atherosclerosis.

The osseous structures are unremarkable.
IMPRESSION: 1. No urolithiasis or obstructive uropathy.

[REDACTED]

## 2014-06-06 IMAGING — CR DG LUMBAR SPINE 2-3V
1 series · 3 of 3 positions shown · non-contrast
Comparison: none

REASON FOR EXAM: lower back pain
COMMENTS:

PROCEDURE:     DXR - DXR LUMBAR SPINE AP AND LATERAL  - April 02, 2012  [DATE]
RESULT:     Comparison: Bone survey 10/15/2010

[Series 3: w lumbar spine ap · 0.14mm/px · 3 of 3 slices shown]
[im 1/3]
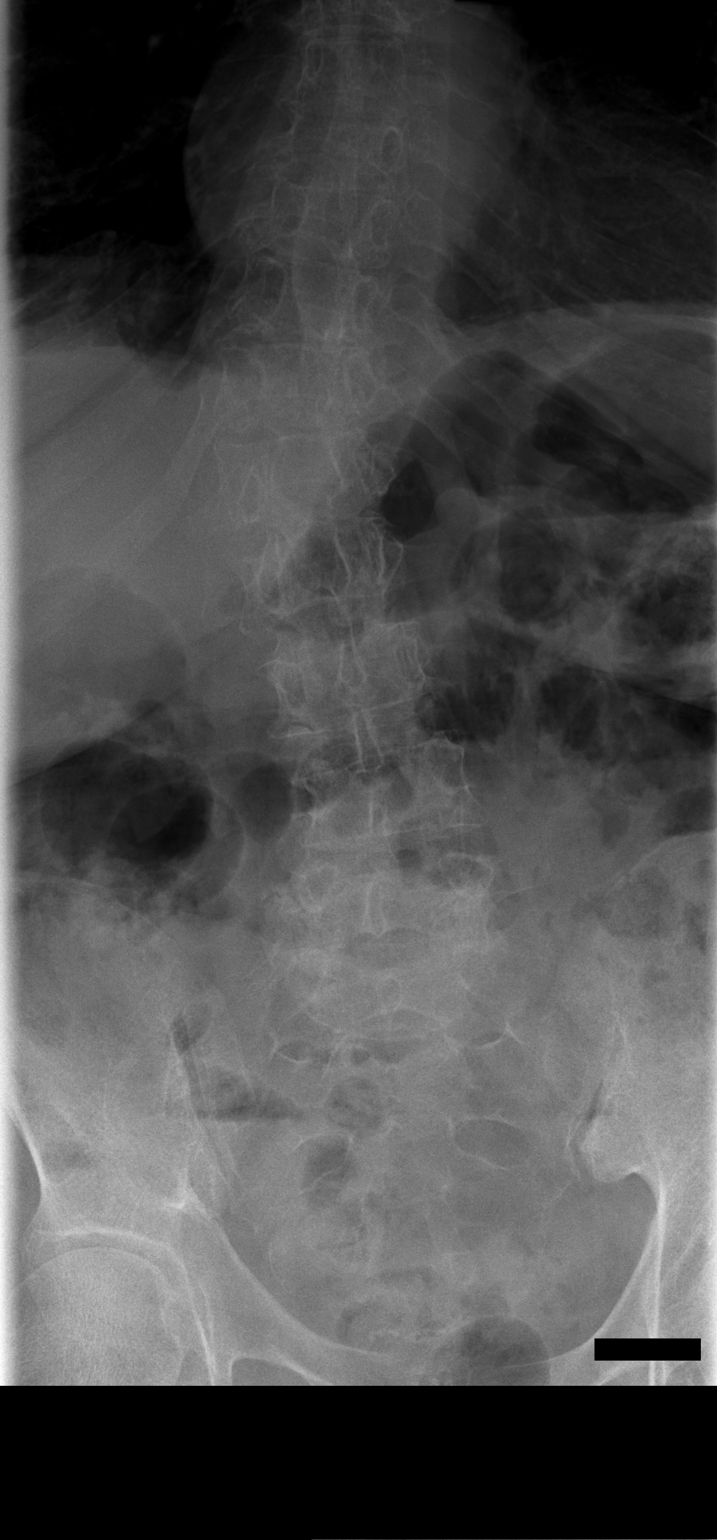
[im 2/3]
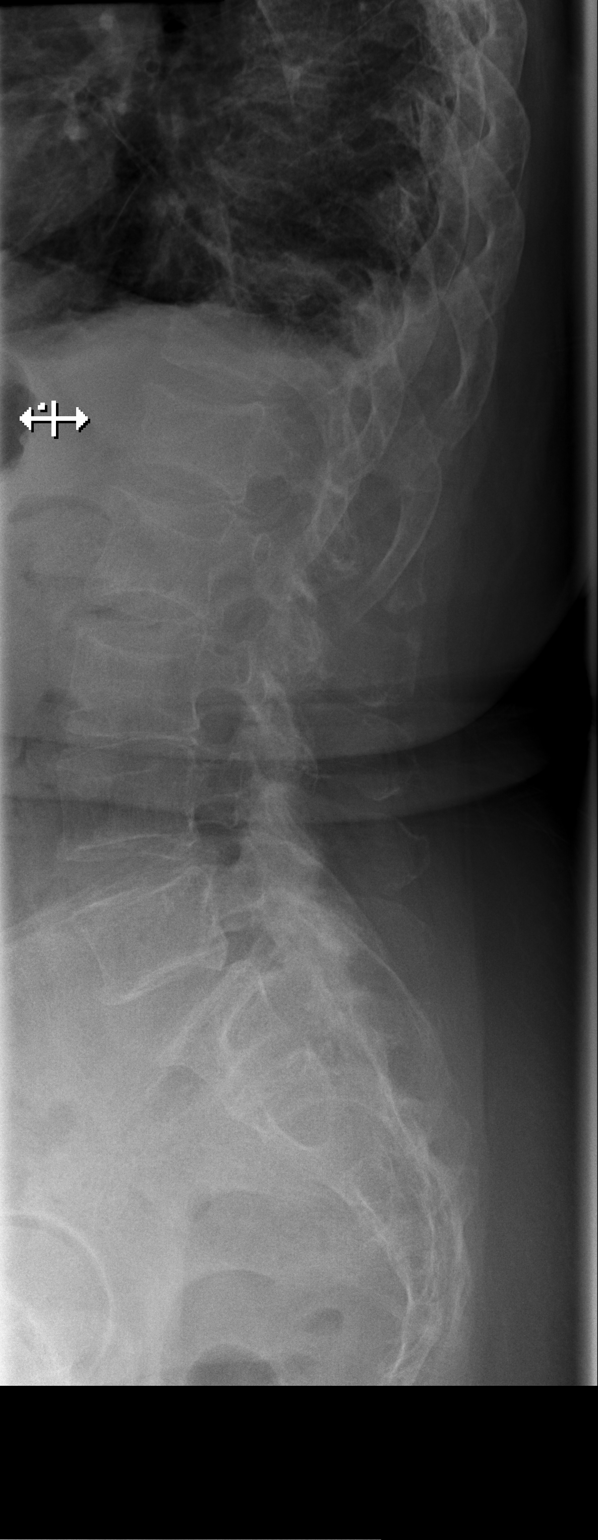
[im 3/3]
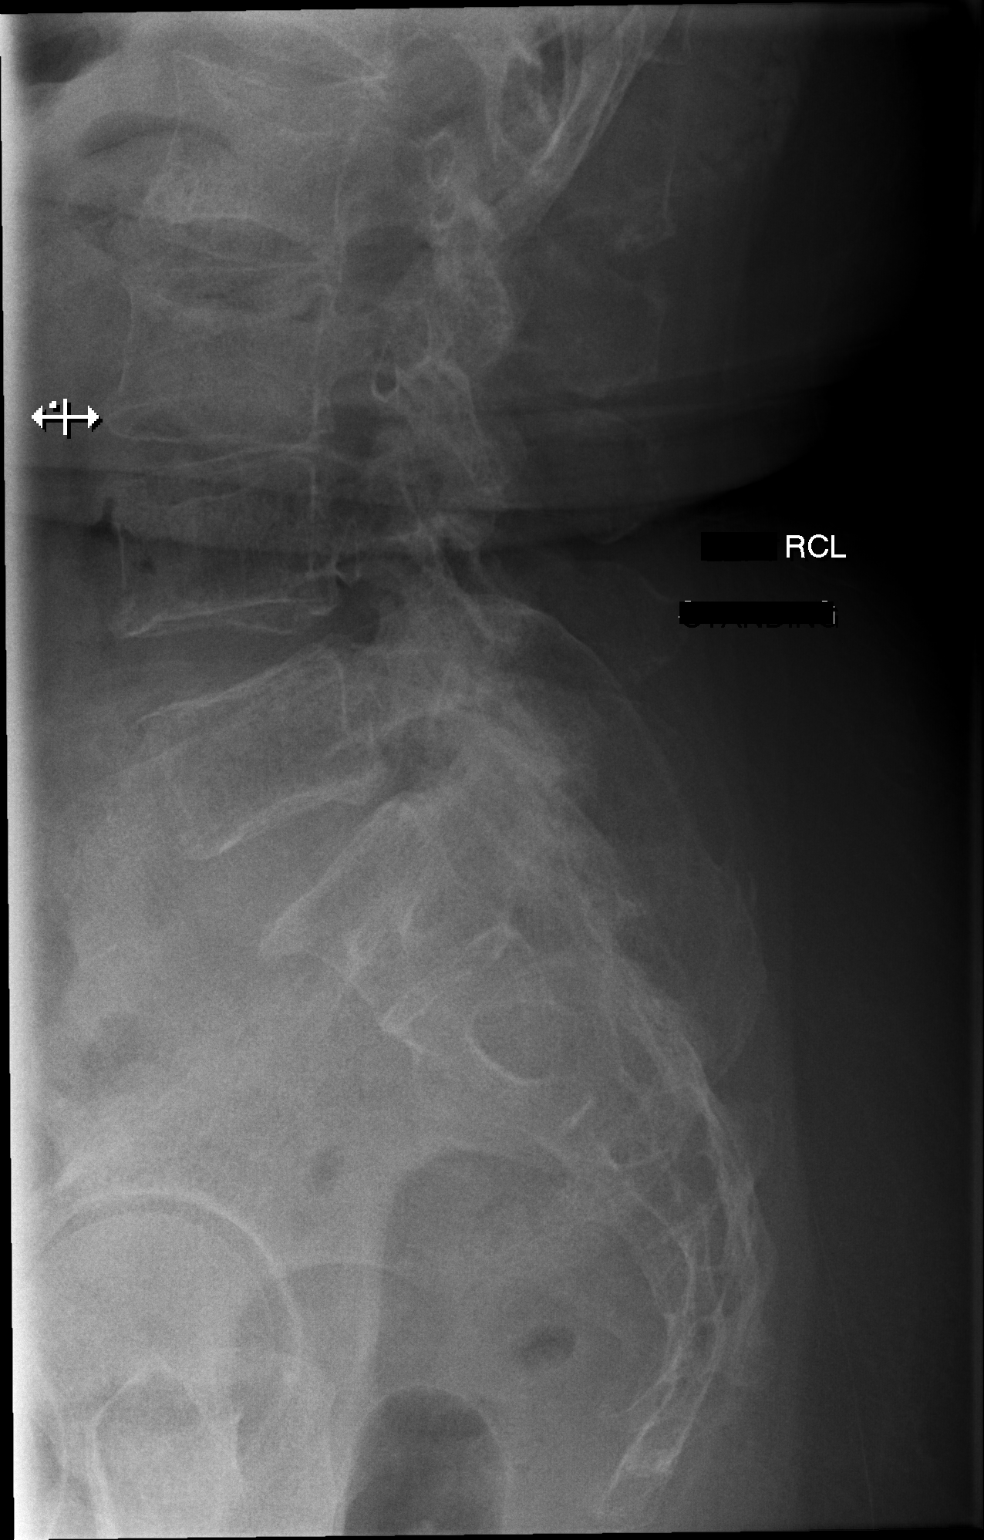

[3 of 3 positions shown; findings below may reference images not displayed]

FINDINGS: There are 5 lumbar type vertebral bodies. There is mild dextrocurvature of
the lumbar and lower thoracic spine. Mild multilevel vertebral body height
loss is similar to prior. There is normal alignment. Intervertebral disc
heights are relatively preserved.
IMPRESSION: No acute findings.

[REDACTED]

## 2014-08-26 IMAGING — CR DG CHEST 2V
1 series · 2 of 2 positions shown · non-contrast
Comparison: none

REASON FOR EXAM: hypoxia
COMMENTS:

[Series 1: pa · 0.17mm/px · 2 of 2 slices shown]
[im 1/2]
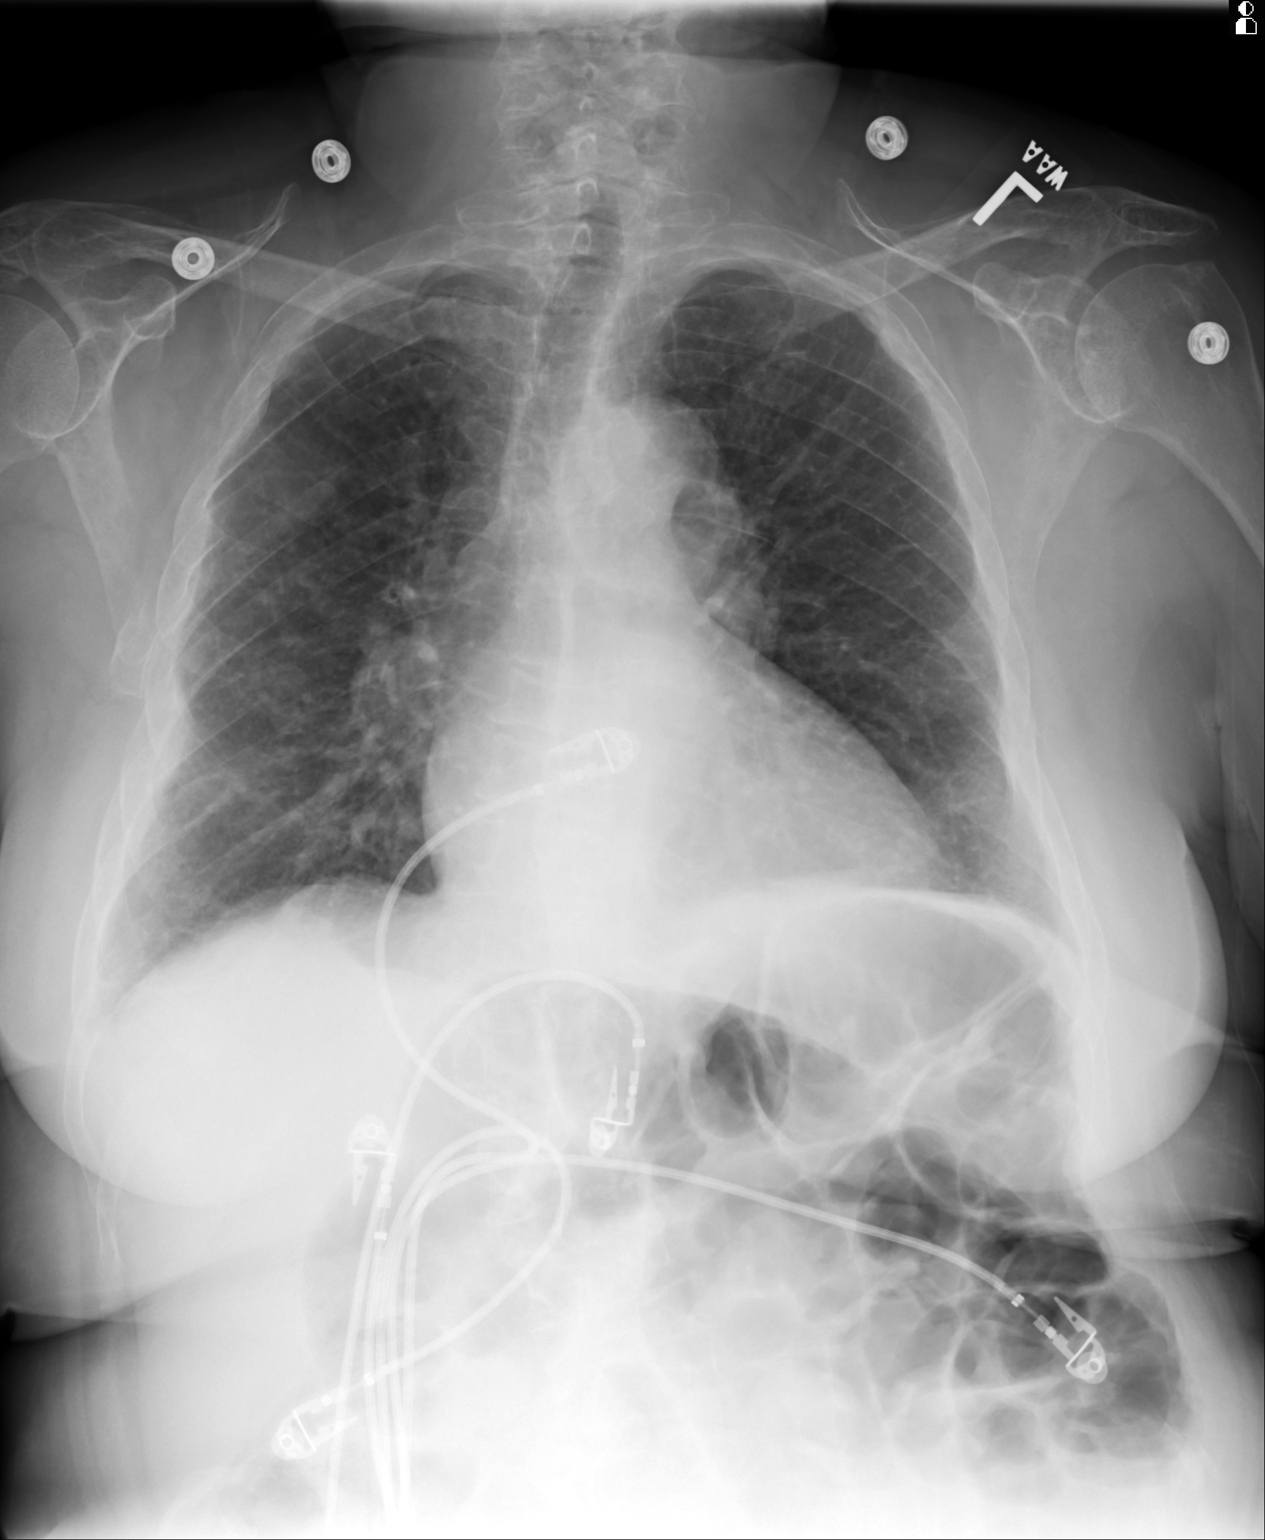
[im 2/2]
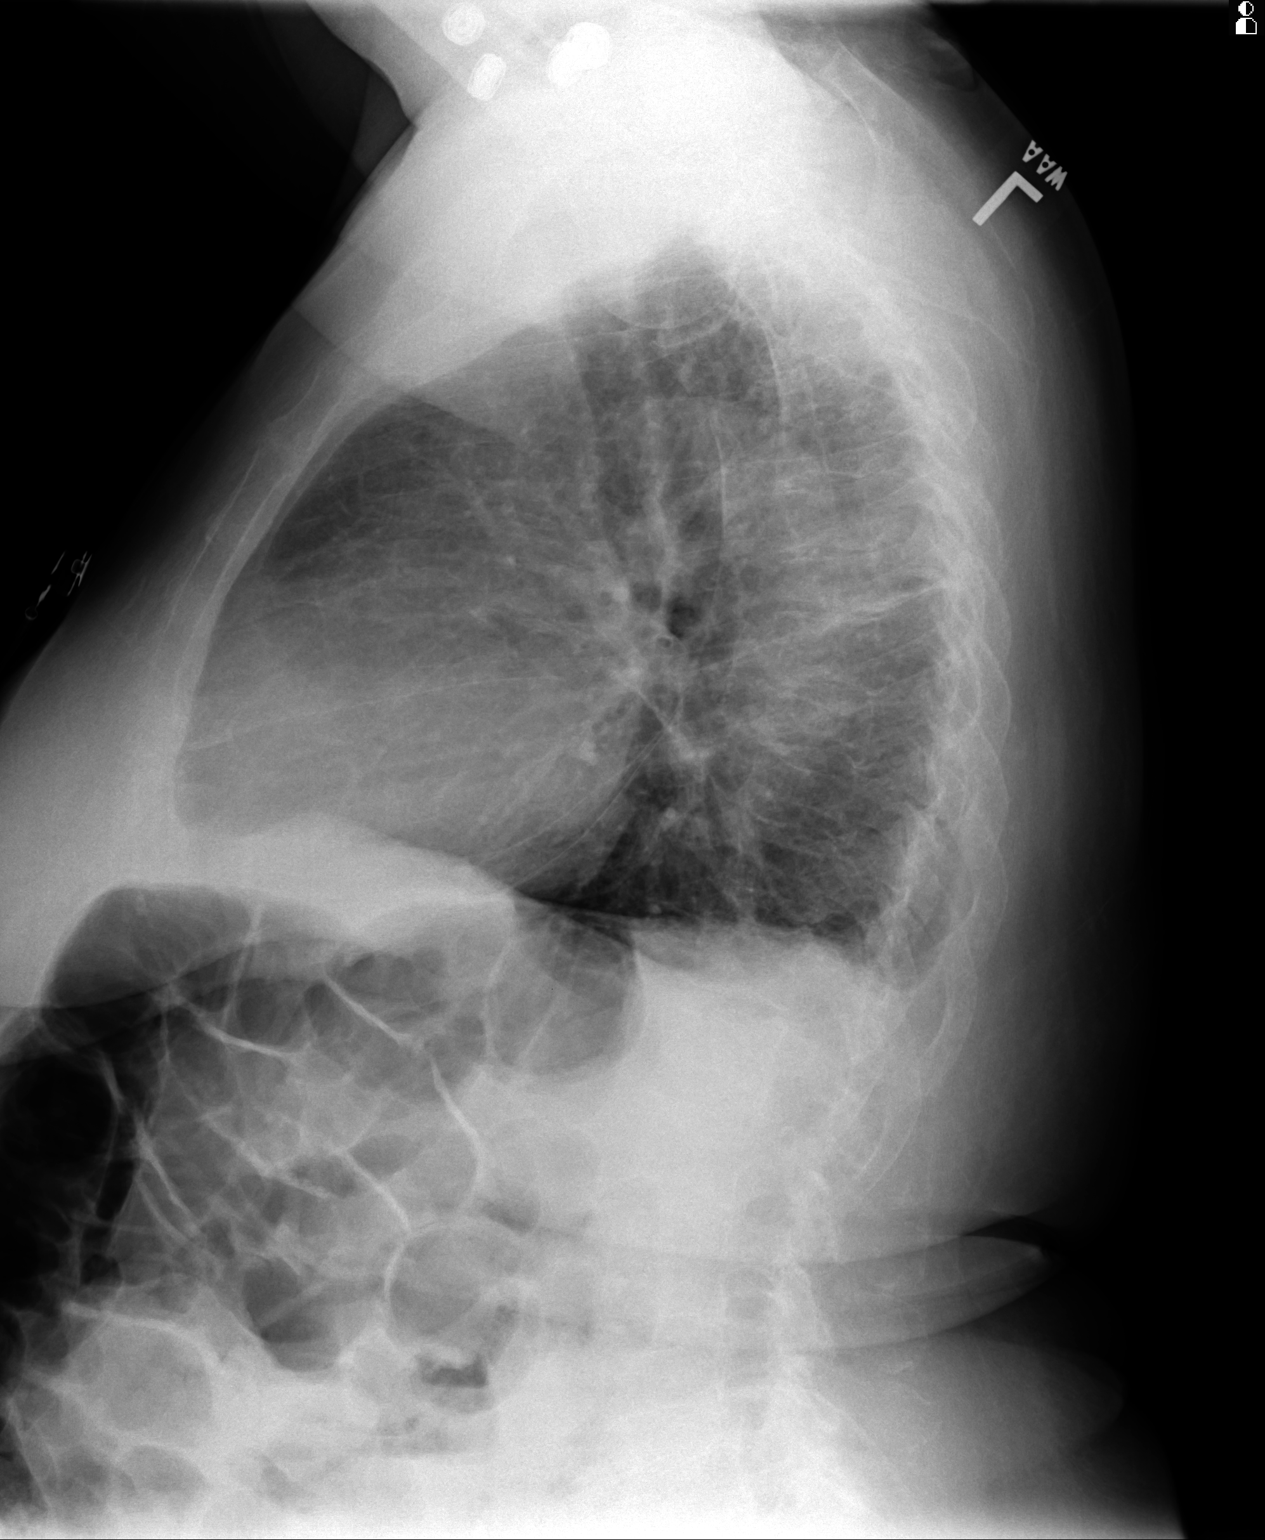

[2 of 2 positions shown; findings below may reference images not displayed]

PROCEDURE:     DXR - DXR CHEST PA (OR AP) AND LATERAL  - June 22, 2012  [DATE]

RESULT:     Comparison is made to the study June 21, 2012.

The lungs are adequately inflated with a  suggestion of mild hemidiaphragm
flattening. The interstitial markings are increased diffusely though this is
not new. The cardiac silhouette is top normal in size. The pulmonary
vascularity is not clearly engorged. There is tortuosity of the descending
thoracic aorta. Small amounts of pleural fluid blunt the posterior
costophrenic angles.
IMPRESSION: The findings are consistent with low-grade CHF superimposed
upon COPD. There is no focal pneumonia.

[REDACTED]

## 2014-09-06 NOTE — H&P (Signed)
PATIENT NAME:  Paula Stone, Paula Stone MR#:  [id number] DATE OF BIRTH:  [birth year]  DATE OF ADMISSION:  03/16/2012  PRIMARY CARE PHYSICIAN: Kingsley Spittle, MD  REFERRING PHYSICIAN: Murlean Iba, MD  REASON FOR ADMISSION: Dehydration, progressive renal failure.   HISTORY OF PRESENT ILLNESS: The patient is a 69 year old white female with history of multiple myeloma and chronic renal failure who is followed by Dr. Candiss Norse for her chronic kidney disease who was seen by him about two to three weeks ago. At that time, blood work was done which showed worsening renal function with a GFR of approximately 20. The patient went to see him back and she reported that she has been progressively getting weak, has not been able to eat or drink. Every time she tries to eat she eats a few bites and then becomes very nauseous. She also has been throwing up once or twice a day. She has not had any diarrhea. She otherwise denies any fevers or chills, no chest pains, and no shortness of breath or abdominal pain. She denies any urinary symptoms.   PAST MEDICAL HISTORY:  1. Multiple myeloma, followed by Dr. Grayland Ormond. According to his notes from the office in August, he reported the patient's last M spike continues to increase to 1. She has refused reinitiating Revlimid. However, she did express on her last evaluation that she may need to be restarted on this.  2. History of chronic renal failure. Has been followed by nephrology. Her renal function has slowly gotten worse.  3. History of hypertension.  4. History of gout.  5. History of degenerative joint disease.   PAST SURGICAL HISTORY: Status post left arm arteriovenous shunt placement.   ALLERGIES: No known drug allergies.   MEDICATIONS: She states that she is not on any medications. She was on lisinopril for blood pressure, which she was stopped due to her renal function worsening.   FAMILY HISTORY: Positive for diabetes.   SOCIAL HISTORY: Lives alone. Does not smoke or  drink.   REVIEW OF SYSTEMS: CONSTITUTIONAL: Complains of generalized weakness, fatigue. No pain. Reports 10 pound weight loss in the past two months. HEENT: Denies any cataracts. No glaucoma. No double vision. No erythema. ENT: Denies any tinnitus. No seasonal or year-round allergies. No difficulty swallowing. No difficulty with hearing. CARDIOVASCULAR: Denies any chest pain or palpitations, no syncope, and no arrhythmias. PULMONARY: Denies any cough, wheezing. No shortness of breath. No hemoptysis. GI: Complains of some nausea and intermittent vomiting. No diarrhea. No hematemesis. No hematochezia. GENITOURINARY: Denies any frequency, urgency, or hesitancy. ENDOCRINE: Denies any polydipsia or polyphagia. Complains of feeling cold. SKIN: Denies any rash. LYMPHATICS: Denies any lymph node enlargements. HEMATOLOGIC: Has diagnosis of multiple myeloma. NEUROLOGIC: Denies any numbness, cerebrovascular accident, or transient ischemic attack. PSYCHIATRIC: Denies any anxiety or depression.   PHYSICAL EXAMINATION:   VITAL SIGNS: Temperature 98.5, pulse 90, respirations 18, blood pressure 126/77, and saturation 100%.   GENERAL: The patient is a 69 year old African American female who is currently not in any acute distress.   HEAD/FACE: Head atraumatic, normocephalic.   EYES: Pupils equally round and reactive to light and accommodation. There is no conjunctival pallor. No scleral icterus.   NOSE/THROAT: Nasal exam shows no drainage or ulceration. Oropharynx is clear without any exudates.   NECK: No thyromegaly. No carotid bruits.   CARDIOVASCULAR: Regular rate and rhythm. No murmurs, rubs, clicks, or gallops. PMI is not displaced.   PULMONARY: Lungs are clear to auscultation bilaterally without any rales, rhonchi, or wheezing.  ABDOMEN: Soft, nontender, and nondistended. Positive bowel sounds x4. There is no hepatosplenomegaly.   EXTREMITIES: No clubbing, cyanosis, or edema.   SKIN: No rash.    LYMPHATICS: No lymph nodes palpable.   VASCULAR: Good DP and PT pulses.   PSYCHIATRIC: Not anxious or depressed.   NEUROLOGICAL: Cranial nerves II through XII grossly intact. No focal deficits.   LABS: Currently no labs are available, they are pending.   ASSESSMENT AND PLAN: The patient is a 69 year old with history of chronic kidney disease, hypertension, and multiple myeloma who sees Dr. Candiss Norse who saw her in the office today and referred her for admission. The patient is not eating or drinking much and has been nauseous and throwing up.  1. Acute dehydration as result of lack of p.o. intake. We will give her IV fluids.  2. Chronic renal failure, likely stage IV to V, worsening, may need to start on hemodialysis per nephrology. We will ask nephrology to come see.  3. Multiple myeloma. Is being followed by Dr. Grayland Ormond.  4. Loss of appetite and nausea. We will try Megace. We will do p.r.n. antiemetics. We will check LFTs and lipase.  5. Hypertension. The patient was on lisinopril, taken off due to worsening renal function. For now we will monitor renal function.          6. Miscellaneous. We will place her on Lovenox for deep vein thrombosis prophylaxis.  TIME SPENT: 35 minutes.  ____________________________ Lafonda Mosses Posey Pronto, MD shp:slb D: 03/16/2012 17:01:00 ET T: 03/16/2012 17:48:58 ET JOB#: [id number]  cc: Leighann Amadon H. Posey Pronto, MD, <Dictator> Ellamae Sia, MD Alric Seton MD ELECTRONICALLY SIGNED 03/20/2012 17:36

## 2014-09-06 NOTE — Op Note (Signed)
PATIENT NAME:  Paula Stone, Paula Stone MR#:  [id number] DATE OF BIRTH:  10-Oct-1945  DATE OF PROCEDURE:  12/04/2011  PREOPERATIVE DIAGNOSES:  1. Chronic kidney disease nearing dialysis dependence.  2. Hypertension.  3. Multiple myeloma.   POSTOPERATIVE DIAGNOSES:  1. Chronic kidney disease nearing dialysis dependence.  2. Hypertension.  3. Multiple myeloma.   PROCEDURE: Left brachiocephalic AV fistula creation.   SURGEON: Algernon Huxley, M.D.   ANESTHESIA: General.   ESTIMATED BLOOD LOSS: Approximately 25 mL.   INDICATION FOR PROCEDURE: This is a 69 year old African American female with chronic kidney disease nearing end-stage renal disease. She is not yet on dialysis but will require dialysis in the near future. Fistula creation is recommended for permanent dialysis access. The risks and benefits are discussed and informed consent was obtained.   DESCRIPTION OF PROCEDURE: The patient was brought to the operative suite and after an adequate level of general anesthesia was obtained, the left upper extremity was sterilely prepped and draped and a sterile surgical field was created. A curvilinear incision was created at the antecubital fossa. The cephalic vein was dissected out and found to be a nice vein usable for dialysis. Two vein branches were ligated and divided between silk ties. It was marked for orientation. The brachial artery was then dissected out. The brachial artery was on the small side but not heavily diseased. It did appear to be adequate for fistula creation. This was controlled with vessel loops proximally and distally. The patient was systemically heparinized and control was pulled up on the vessel loops and an anterior wall arteriotomy was created with an 11 blade and extended with Potts scissors. The vein was then ligated distally, cut and beveled to an appropriate length to match the anterior wall arteriotomy and an anastomosis was created with a running 6-0 Prolene suture in the usual  fashion. The vessel was flushed and de-aired prior to release of control. A single 6-0 Prolene patch suture was used for hemostasis. There was some vasospasm treated with topical nitroglycerin and at the conclusion of the procedure there was an excellent thrill within the AV fistula and a palpable pulse distal to the fistula. The wound was then irrigated. Surgicel was placed and the wound was closed with a running 3-0 Vicryl and 4-0 Monocryl. Dermabond was placed as a dressing. The patient tolerated the procedure well and was taken to the recovery room in stable condition.  ____________________________ Algernon Huxley, MD jsd:slb D: 12/04/2011 12:06:56 ET T: 12/04/2011 12:29:59 ET JOB#: [id number]  cc: Algernon Huxley, MD, <Dictator> Ellamae Sia, MD Anthonette Legato, MD / Mamie Levers, MD, Murlean Iba, MD Algernon Huxley MD ELECTRONICALLY SIGNED 12/09/2011 16:19

## 2014-09-06 NOTE — Discharge Summary (Signed)
PATIENT NAME:  Paula Stone, Paula Stone MR#:  [id number] DATE OF BIRTH:  10-31-1945  DATE OF ADMISSION:  03/19/2012 DATE OF DISCHARGE:  03/21/2012   PRIMARY DOCTOR: Kingsley Spittle, MD   DISCHARGE DIAGNOSES:  1. 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 liklwy due to Myeloma 2. Advanced multiple myeloma. 3. Anemia of chronic kidney disease. 4. Hypertension. 5. Loss of appetite.  6. Gout.   DISCHARGE MEDICATIONS:  1. Allopurinol 100 mg p.o. b.i.d. as needed for gout. 2. Megace 40 mg p.o. daily.  3. Lidocaine topical ointment to affected area daily.   DIET: Renal diet.   FOLLOW-UP:  1. Follow-up on Tuesday, November 5th, for dialysis at [facility].  2. Follow-up with Dr. Grayland Ormond on Monday, November 4th, for follow-up of multiple myeloma.   CONSULTATIONS:  1. Nephrology consult with Dr. Anthonette Legato  2. Oncology consult with Dr. Dianna Rossetti COURSE: The patient is a 69 year old female with history of chronic kidney disease who came in because of dehydration, poor appetite, and nausea. The patient follows up with Dr. Candiss Norse on a regular basis because of chronic kidney disease. The patient's BUN and creatinine on admission showed BUN 114 and creatinine of 9.13. The patient's GFR in June was 21. On October 9th creatinine was 4.4 and GFR was 11. When she was admitted creatinine was much worse. The patient was started on dialysis immediately. The patient received dialysis from her left AV fistula. The patient was told to have a biopsy before at [facility] but it was not pursued. The patient's ESRD is thought to be secondary to ckd  progress versus multiple myeloma so the patient had SPEP and UPEP and Hematology consult also was requested with Dr. Grayland Ormond. The patient is right now on Tuesday, Thursday, Saturday hemodialysis. Case Manager set up the appointment with [facility] on [address]. The patient will get dialysis Tuesday, Thursday, and Saturday from there. The patient had first  dialysis on the 29th. The patient tolerated the procedure well and most recent creatinine is 7. That he is on October 30th and stable. The patient's blood pressure medications have been stopped because of worsening of kidney function. The patient's blood pressure has been stable at 100/65 and heart rate around 91. The patient was taking lisinopril for blood pressure before but that is stopped because of worsening of kidney function. Anyway, her blood pressure has been around 100/68 and heart rate is around 90 so we did not restart any blood pressure medications. The patient has multiple myeloma. The patient sees Dr. Grayland Ormond. The patient's M-Spike was 1 when he saw her in August. The patient did not want to reinitiate treatment. The patient  had SPEP and UPEP. SPEP showed M-Spike of 2.9 and UPEP also showed 32 total protein and also M-Spike 32.7 indicating progression of multiple myeloma and renal failure thought to be secondary to her multiple myeloma as well. Anyway, she is now needing dialysis on a regular basis. Regarding her multiple myeloma, she will be seeing Dr. Grayland Ormond on Monday. She will restart Velcade on Monday with Dr. Grayland Ormond and for her back pain she was started on lidocaine patch. For poor appetite, she is on Megace. She will have dialysis on Tuesday at [facility] on [address]. Case Manager also set up transportation help for her from home to dialysis.   TIME SPENT ON DISCHARGE PREPARATION: More than 30 minutes.   ____________________________ Epifanio Lesches, MD sk:drc D: 03/21/2012 22:27:56 ET T: 03/23/2012 10:35:46 ET JOB#: [id number]  cc:  Epifanio Lesches, MD, <Dictator> Ellamae Sia, MD Kathlene November. Grayland Ormond, MD Murlean Iba, MD Epifanio Lesches MD ELECTRONICALLY SIGNED 03/31/2012 19:51

## 2014-09-06 NOTE — Consult Note (Signed)
History of Present Illness:   Reason for Consult Multiple myeloma now with acute renal failure.    HPI   Patient last seen in clinic on January 16, 2012.  Patient states she is her usual state of health until several weeks ago when she became persistently nauseous and was unable to tolerate PO intake.  She otherwise has felt well.  She denies any recent fevers. She has no neurologic complaints.  She has no chest pain, cough, or shortness of breath. She has no vomiting, constipation, or diarrhea.  She denies any pain.  Patient offers no further specific complaints today.  PFSH:   Additional Past Medical and Surgical History Past medical history: Chronic renal disease, hypertension, gout, diabetes, DJD.  Past surgical history: Left arm AV shunt placement.  Family history: Negative and noncontributory.  Social history: Patient denies tobacco or alcohol.   Review of Systems:   Performance Status (ECOG) 0    Review of Systems   As per HPI. Otherwise, 10 point system review was negative.  NURSING NOTES: **Vital Signs.:   29-Oct-13 08:13    Vital Signs Type: Routine    Temperature Temperature (F): 98.5    Celsius: 36.9    Temperature Source: oral    Pulse Pulse: 77    Respirations Respirations: 18    Systolic BP Systolic BP: 659    Diastolic BP (mmHg) Diastolic BP (mmHg): 74    Mean BP: 86    BP Source  if not from Vital Sign Device: non-invasive    Pulse Ox % Pulse Ox %: 98    Pulse Ox Activity Level: At rest    Oxygen Delivery: Room Air/ 21 %   Physical Exam:   Physical Exam General: Well-developed, well-nourished, no acute distress. Eyes: Pink conjunctiva, anicteric sclera. HEENT: Normocephalic, moist mucous membranes, clear oropharnyx. Lungs: Clear to auscultation bilaterally. Heart: Regular rate and rhythm. No rubs, murmurs, or gallops. Abdomen: Soft, nontender, nondistended. No organomegaly noted, normoactive bowel sounds. Musculoskeletal: No edema,  cyanosis, or clubbing. Neuro: Alert, answering all questions appropriately. Cranial nerves grossly intact. Skin: No rashes or petechiae noted. Psych: Normal affect.    No Known Allergies:     allopurinol 100 mg oral tablet: 1 tab(s) orally 2 times a day as needed, Active, 0, None  Laboratory Results: Hepatic:  29-Oct-13 04:21    Bilirubin, Total 0.2   Alkaline Phosphatase  40   SGPT (ALT)  10   SGOT (AST)  14   Total Protein, Serum  8.7   Albumin, Serum  2.6  Routine Chem:  29-Oct-13 04:21    Glucose, Serum 97   BUN  114   Creatinine (comp)  9.13   Sodium, Serum 140   Potassium, Serum 5.1   Chloride, Serum  108   CO2, Serum  17   Calcium (Total), Serum 9.9   Osmolality (calc) 316   eGFR (African American)  5   eGFR (Non-African American)  4 (eGFR values <88m/min/1.73 m2 may be an indication of chronic kidney disease (CKD). Calculated eGFR is useful in patients with stable renal function. The eGFR calculation will not be reliable in acutely ill patients when serum creatinine is changing rapidly. It is not useful in  patients on dialysis. The eGFR calculation may not be applicable to patients at the low and high extremes of body sizes, pregnant women, and vegetarians.)   Anion Gap 15   Magnesium, Serum 1.9 (1.8-2.4 THERAPEUTIC RANGE: 4-7 mg/dL TOXIC: > 10 mg/dL  -----------------------)  Routine  Hem:  29-Oct-13 04:21    WBC (CBC) 6.1   RBC (CBC)  2.66   Hemoglobin (CBC)  8.2   Hematocrit (CBC)  25.5   Platelet Count (CBC)  83   MCV 96   MCH 30.8   MCHC 32.1   RDW  15.7   Neutrophil % 56.4   Lymphocyte % 30.5   Monocyte % 12.0   Eosinophil % 0.8   Basophil % 0.3   Neutrophil # 3.4   Lymphocyte # 1.9   Monocyte # 0.7   Eosinophil # 0.0   Basophil # 0.0 (Result(s) reported on 17 Mar 2012 at 04:45AM.)   Assessment and Plan:  Impression:   Multiple myeloma, now with acute renal failure.  Plan:   1.  Multiple myeloma: Patient's M spike previously was  continually increasing indicating probable recurrence, but she refused to reinitiate treatments. Her most recent bone survey in May 2012 was reported as normal.  It is possible her acute renal failure is related to underlying progressive disease, but she may require a kidney biopsy to confirm.  SIEP and UIEP are currently pending.   Anemia: Patient will likely receive Procrit while receiving dialysis.  Will check iron stores for completeness. Thrombocytopenia: Possibly multifactorial given kidney failure, dialysis, and progressive multiple myeloma.  Monitor. Acute renal failure: Dialysis as above. consult, will follow.   Electronic Signatures: Delight Hoh (MD)  (Signed 29-Oct-13 15:24)  Authored: HISTORY OF PRESENT ILLNESS, PFSH, ROS, NURSING NOTES, PE, ALLERGIES, HOME MEDICATIONS, LABS, ASSESSMENT AND PLAN   Last Updated: 29-Oct-13 15:24 by Delight Hoh (MD)

## 2014-09-09 NOTE — Discharge Summary (Signed)
PATIENT NAME:  Paula Stone, Paula Stone MR#:  [id number] DATE OF BIRTH:  September 09, 1945  DATE OF ADMISSION:  11/04/2012 DATE OF DISCHARGE:  11/05/2012  DISCHARGE DIAGNOSES 1.  Multiple myeloma.  2.  End-stage renal disease.   SECONDARY DIAGNOSES 1.  Chronic respiratory failure.  2.  Chronic thrombocytopenia.  3.  Anemia of chronic disease.  4.  Gout.  5.  Hypertension.   CONSULTATION: Palliative care, Dr. Ermalinda Memos.   PROCEDURES/RADIOLOGY: Chest x-ray on the 18th of June showed:  1.  Findings secondary to interstitial pulmonary edema, findings likely secondary to interstitial pulmonary edema.  2.  Small right pleural effusion decreased from prior.  3.  Mild right basilar opacities likely secondary to atelectasis from the effusion.   HISTORY AND SHORT HOSPITAL COURSE: The patient is a 69 year old female with the above-mentioned medical problems who was admitted for acute on chronic respiratory failure, likely due to malignant pleural effusion secondary to multiple myeloma. Palliative care consultation was obtained with Dr. Ermalinda Memos who had a discussion with the patient and family, after which the patient was agreeable for hospice services and was discharged to hospice home on 19th of June.   PHYSICAL EXAMINATION VITAL SIGNS: On the date of discharge, her vital signs were as follows: Temperature 97.3, heart rate 72 per minute, blood pressure 103/62 mmHg. She was saturating 98% on 2 liters oxygen via nasal cannula.  CARDIOVASCULAR: S1, S2 normal. No murmurs, rubs or gallop.  LUNGS: Decreased breath sounds at the bases bilaterally. No wheezing, rales, rhonchi or crepitation.  ABDOMEN: Soft, benign.  NEUROLOGIC: Unable to assess much. The patient was moving all her extremities without difficulty. She was lethargic on and off.   All other physical examination remained at baseline.    DISCHARGE MEDICATIONS 1.  Dexamethasone 4 mg p.o. daily.  2.  Acetaminophen 650 mg p.o. every 4 hours as needed.   3.   Zofran 4 mg p.o. every 6 hours as needed.  4.  Senna 1 tablet p.o. b.i.d. as needed.  5.  Colace 100 mg p.o. b.i.d. as needed.  6.  Lorazepam 0.5 mg, 1 to 2 tablets p.o. every 4 hours sublingual/orally as needed.  7.  Morphine 20 mg per mL, 0.5 mL p.o. every 1 to 2 hours as needed.   DISCHARGE DIET: As tolerated.   DISCHARGE ACTIVITY: As tolerated.   DISCHARGE INSTRUCTIONS AND FOLLOWUP: The patient to use 2 liters oxygen via nasal cannula as needed. Foley catheter to be left indwelling for prevention of skin breakdown and urinary incontinence. Medications to be crushed or use liquid when appropriate. May change to rectal route if unable to swallow.   TOTAL TIME DISCHARGING THIS PATIENT: 63mnutes.    ____________________________ VLucina Mellow SManuella Ghazi MD vss:cs D: 11/08/2012 16:44:49 ET T: 11/08/2012 20:53:13 ET JOB#: [id number] cc: Rhandi Despain S. SManuella Ghazi MD, <Dictator> HEllamae Sia MD TKathlene November FGrayland Ormond MD NEfraim Kaufmann MD  VIotaMD ELECTRONICALLY SIGNED 11/09/2012 11:27

## 2014-09-09 NOTE — Discharge Summary (Signed)
PATIENT NAME:  Paula Stone, Paula Stone MR#:  [id number] DATE OF BIRTH:  [birth year]  DATE OF ADMISSION:  06/21/2012 DATE OF DISCHARGE:  06/22/2012  PRIMARY CARE PHYSICIAN: Remus Blake, MD  PRIMARY NEPHROLOGIST: Murlean Iba, MD   DISCHARGE DIAGNOSES: 1.  Acute bronchitis  2.  Malignant hypertension.  3.  Elevated troponin secondary to acute bronchitis in the setting of end-stage renal disease.  4.  End-stage renal disease on hemodialysis.  5.  Chronic thrombocytopenia.  6.  Multiple myeloma.   CONSULTANTS: Dr. Candiss Norse of nephrology and Dr. Grayland Ormond of hematology.   ADMITTING HISTORY AND PHYSICAL AND HOSPITAL COURSE: Please see detailed history and physical dictated previously. In brief, this is a 69 year old female patient with history of multiple myeloma, end-stage renal disease, on hemodialysis who presented to the Emergency Room complaining of one day of shortness of breath, congestion and wheezing with a dry cough. The patient was found to have elevated troponin 0.11, elevated BNP, fever of 100, chest x-ray is showing pulmonary edema was admitted to the hospitalist service.   HOSPITAL COURSE: 1.  Acute bronchitis. The patient was found to have acute bronchitis with wheezing and started on nebulizer treatment along with antibiotics and steroids with which she improved well. On the day of discharge she is on room air, saturating 97%, without any wheezing on exam and was discharged home in fair condition. There was a concern for pulmonary edema initially, but she did not have any clear signs of congestive heart failure and will be continued on dialysis. This was discussed with Dr. Candiss Norse, who agreed with continuing dialysis as an outpatient schedule.  2.  Multiple myeloma. The patient was seen by Dr. Grayland Ormond of hematology in the hospital and the patient received a dose of her chemotherapy with Remicade and was discharged home after noting normal vitals after the dosing.   DISCHARGE MEDICATIONS:  Include:   1.  Dexamethasone 4 mg oral once a week on Mondays.  2.  Compazine 10 mg oral every six hours.  3.  Oxycodone 10 mg one tablet oral every six hours as needed for pain.  4.  Calcium acetate two capsules oral three times a day.  5.  Allopurinol 100 mg oral two times a day.  6.  Albuterol two puffs inhaled four times a day as needed.  7.  Prednisone 10 mg tablet, 60 mg tapered over six days.  8.  Lisinopril  40 mg oral once a day.  9.  Levofloxacin 250 mg oral every other day for five days.   DISCHARGE INSTRUCTIONS: The patient will be on a renal diet. Activity as tolerated. Follow up with Dr. Candiss Norse and  Dr. Grayland Ormond along with her primary care physician in 1-2 weeks.   TIME SPENT: Time spent on day of discharge in discharge activity was 45 minutes medications.     ____________________________ Leia Alf Felma Pfefferle, MD srs:cc D: 06/23/2012 15:44:17 ET T: 06/23/2012 23:15:14 ET JOB#: [id number]  cc: Alveta Heimlich R. Linkyn Gobin, MD, <Dictator> Kathlene November. Grayland Ormond, MD Murlean Iba, MD Alveta Heimlich Arlice Colt MD ELECTRONICALLY SIGNED 06/25/2012 13:27

## 2014-09-09 NOTE — Op Note (Signed)
PATIENT NAME:  Paula Stone, Paula Stone MR#:  [id number] DATE OF BIRTH:  06/06/1945  DATE OF PROCEDURE:  09/14/2012  PREOPERATIVE DIAGNOSES: 1.  End-stage renal disease. 2.  Poorly functioning left arm arteriovenous fistula with decreased flows. 3.  Hypertension.   POSTOPERATIVE DIAGNOSES:  1.  End-stage renal disease. 2.  Poorly functioning left arm arteriovenous fistula with decreased flows. 3.  Hypertension.   PROCEDURES: 1.  Ultrasound guidance for vascular access, left brachiocephalic arteriovenous fistula.  2.  Left upper extremity fistulogram and central venogram.  3.  Percutaneous transluminal angioplasty of mid upper arm cephalic vein with 8 mm diameter angioplasty balloon.   SURGEON: Annice NeedyJason S. Verlean Allport, M.D.   ANESTHESIA: Local with moderate conscious sedation.   ESTIMATED BLOOD LOSS: Approximately 25 mL.  FLUOROSCOPY TIME: Approximately 2 minutes.   CONTRAST USED: 30 mL.   INDICATION FOR PROCEDURE: A 69 year old African American female with end-stage renal disease. She has had diminishing flows with her access and we are asked to evaluate this. Risks and benefits of fistulogram were discussed. Informed consent was obtained.   DESCRIPTION OF PROCEDURE: The patient was brought to the vascular and interventional radiology suite.  The left upper extremity was sterilely prepped and draped and a sterile surgical field was created. The fistula was accessed without difficulty under direct ultrasound guidance with a micropuncture needle.  Micropuncture wire and sheath were then placed.  I up-sized to a 6-French sheath and gave the patient 3000 units of intravenous heparin for systemic anticoagulation. Imaging showed a pretty high-grade stenosis in the 75 to 80% range, in the mid upper arm cephalic vein. This likely corresponded to the venous access site.  The remainder of the fistula was patent including the central venous circulation. I crossed the lesion without difficulty with a Magic torque wire.  I used an 8 mm diameter conventional and then high-pressure angioplasty balloon to treat the lesion. A waste was taken and we inflated to about 20 atmospheres to relieve the waste. With the balloon inflated, we injected contrast to evaluate the anastomotic area, which was widely patent. Completion angiogram following this showed significantly improved flow with a less than 25% residual stenosis. At this point, I elected to terminate the procedure. The sheath was removed around a 4-0 Monocryl pursestring suture, pressure was held, and a sterile dressing was placed. The patient tolerated the procedure well and was taken to the recovery room in stable condition.  ____________________________ Annice NeedyJason S. Alfredia Desanctis, MD jsd:sb D: 09/14/2012 09:20:31 ET T: 09/14/2012 09:27:52 ET JOB#: [id number]  cc: Annice NeedyJason S. Kentaro Alewine, MD, <Dictator> Annice NeedyJASON S Icela Glymph MD ELECTRONICALLY SIGNED 09/16/2012 13:23

## 2014-09-09 NOTE — Consult Note (Signed)
Patient admitted one week ago with complaints of increasing shortness of breath.   Pleural effusion from recent thoracentesis was found to be malignant consistent with her multiple myeloma.  Patient has not received treatment since February of 2014 and has repeatedly declined reinitiating chemotherapy.  Will further discuss reinitiating treatments with patient tomorrow since I am the [facility] all day on Wednesday.  Her thrombocytopenia and anemia are likely multifactorial related to her underlying myeloma as well as end-stage renal disease.  Continue Procrit with dialysis.  Patient does not require transfusion at this time.  Have placed a palliative care consult for further evaluation and to discussion of  goals of therapy.    consult, full consult to follow tomorrow.  Electronic Signatures: Delight Hoh (MD)  (Signed on 04-Jun-14 08:50)  Authored  Last Updated: 04-Jun-14 08:50 by Delight Hoh (MD)

## 2014-09-09 NOTE — H&P (Signed)
PATIENT NAME:  Paula Stone, Paula Stone MR#:  [id number] DATE OF BIRTH:  06-Jul-1945  DATE OF ADMISSION:  10/14/2012  PRIMARY CARE PHYSICIAN: Kingsley Spittle, MD   REFERRING PHYSICIAN: Ferman Hamming, MD   NEPHROLOGIST: Lavonia Dana, MD    CHIEF COMPLAINT: Fever, increased shortness of breath.   HISTORY OF PRESENT ILLNESS: Ms. Bye is a very nice 69 year old female who has history of end-stage renal disease, multiple myeloma and hypertension. The patient comes today with a history of going to dialysis and being told that she has pneumonia and needed to come to the ER. The patient had a history of increased shortness of breath for the past 2 days. This morning, she woke up feeling very bad, significant malaise. She states that she could not hardly take a deep breath. She is better when she sits, worse when she lies down. She has malaise, pains and aches all over her body, and she states that she does not have a cough today, but she has been having occasionally cough once a week or twice a week. She does not have any significant sputum.  She states that she has had a constipation for over a week and occasional palpitations. Here at the ER, the patient had a chest x-ray that showed a significant increase in her previously diagnosed pleural effusion. She has a temperature of 101 but a normal white blood count of 7.6. The patient feels very short of breath when she talks, and at this moment she is going to be admitted for further treatments and evaluation.    REVIEW OF SYSTEMS:  CONSTITUTIONAL: The patient states that she had a fever, had chills, had fatigue, has significant weakness and malaise with pains and aches all over her body. Denies any significant weight  gain or weight loss.  EYES: No blurry region vision, double vision or erythema.  EARS, NOSE, THROAT: No tinnitus. No snoring. No difficulty swallowing.  RESPIRATORY: No cough at this moment, but she stated she has cough twice a week or once a week. No  wheezing. She does not have COPD or asthma, but occasionally uses a nebulizer for the past 4 to 6 months when she was given due to increased shortness of breath based on her fluid overload. No COPD, no previous pneumonia.  CARDIOVASCULAR: No chest pain, orthopnea, edemas or syncope.  GASTROINTESTINAL: No nausea, vomiting, diarrhea, but she is very constipated, more than 1 week without a bowel movement. No jaundice. No rectal bleeding, no hemorrhoids.  GENITOURINARY: No dysuria, hematuria. She does not make much urine. No changes in the amount of urine she produced.  ENDOCRINE: No polyuria, polydipsia, polyphagia, cold or heat intolerance.  HEMATOLOGIC/LYMPHATIC: No anemia, easy bruising or swollen glands.  SKIN: No rashes, petechiae or any moles.   MUSCULOSKELETAL: No significant neck pain, back pain or gout. She has malaise with myalgias all over her body.  NEUROLOGIC: No numbness, weakness, dysarthria, no CVA. No TIA.  PSYCHIATRIC: No significant anxiety or depression.   PAST MEDICAL HISTORY:  1.  Multiple myeloma.  2.  End-stage renal disease on hemodialysis for over a year.  3.  Hypertension.  4.  Gout.  5.  Degenerative joint disease.  6.  Chronic disease anemia.  7.  Chronic thrombocytopenia.   PAST SURGICAL HISTORY: The patient states that she has never had any surgery other than her AV fistula.   MEDICATIONS: Amlodipine 5 mg daily and Ventolin p.r.n.  She states that those are the only medications she takes.   SOCIAL  HISTORY: The patient has never smoked. She has never drank. She has never used any drugs. She lives by herself. She used to be married, but she is separated with her husband. She does not have any kids. She has been living herself for a while.   FAMILY HISTORY: Positive for some type of cancer in her mother, but she does not know what type. No history of MI or coronary artery disease. Positive diabetes in her grandmother.    ALLERGIES: No known drug allergies.    PHYSICAL EXAMINATION: VITAL SIGNS: Blood pressure 156/74, pulse 129, respirations 22, temperature 101.1, pulse oximetry right now is 96 on 3 liters nasal cannula. She came on oxygen from her hemodialysis place.  She is not on oxygen at home.   GENERAL: Alert, oriented x3. No acute distress. No respiratory distress. Hemodynamically stable, chronically ill-looking.  HEENT: Pupils are equal and reactive. Extraocular movements are intact. Mucosa are moist. Anicteric sclerae. Pink conjunctivae. No oral lesions. No oropharyngeal exudates. No thrush.  NECK: Supple. No JVD. No thyromegaly. No adenopathy. No carotid bruits. No rigidity.  CARDIOVASCULAR: Regular rate and rhythm, but tachycardic with occasional PACs. No displacement of PMI. No tenderness to palpation of anterior chest wall.  LUNGS: Positive decreased respiratory sounds on right lower and middle lobe. Dullness to percussion of that same area.  No rales. No wheezing. No crepitus. Lung fields seem appropriately ventilated.  Positive use of accessory muscles, especially when she moves or talks.  ABDOMEN: Soft, nontender, nondistended. No hepatosplenomegaly. No masses. Bowel sounds are positive.  GENITAL: Exam deferred.  EXTREMITIES: No significant edema, cyanosis or clubbing. Pulses +2. Capillary refill less than 3.  NEUROLOGIC: Cranial nerves II through XII are intact. Deep tendon reflexes +2. Strength is 5 out of 5 in all 4 extremities.  PSYCHIATRIC: No significant agitation. No delusions or hallucinations. Alert and oriented x 3.  SKIN: No rashes, petechiae or new moles.  LYMPHATICS: No lymphadenopathy in neck or supraclavicular areas.  VASCULAR: Good pulses, fistula good thrill.  MUSCULOSKELETAL: No significant joint effusions or edema.   LABORATORY AND RADIOLOGICAL DATA:  Glucose 110, creatinine 4.68, sodium 138, potassium 3.7, calcium 10.2, which is a chronically elevated, total protein 9, bilirubin 2.3. Troponin is 0.15, which has  been chronically elevated as well. White count 7.6, hemoglobin 8.9, platelets 86.  EKG: Sinus tachycardia with occasional PACs.   ASSESSMENT AND PLAN: A 69 year old female with a history of end-stage renal disease, hypertension, multiple myeloma, who comes with a history of increased shortness of breath and fever.   1.  Acute respiratory failure:  The patient is requiring 3 liters of oxygen to keep up her oxygen saturation. At this moment, she is mildly having respiratory distress with use of accessory muscles.  Offered to do thoracentesis to help with her #1 diagnosis and get a culture, #2 therapeutic  to help lung expansion. The patient, at this moment, refuses.  She feels like she needs to think about it before actually doing it. For now, we are going to treat it as pneumonia.  I asked  nephrology to check the patient tomorrow for possible hemodialysis as the patient has a little bit of fluid overload. Her BNP is 7400, which is likely elevated chronically, and this is on previous admission on the 19th.  2.  Healthcare-acquired pneumonia:  The patient goes to hemodialysis. She was recently hospitalized here this month for which she is going to be covered with Zosyn, Levaquin and vancomycin, all to be renally dosed.  Blood cultures taken stat. Sputum cultures ordered.  Okay to induce if necessary. Follow up cultures.  3.  Pleural effusion:  The patient has worsening of pleural effusion.  This is likely due to her kidney failure. She seen by Cardiology before for chest pain and has not had any major problems. She has elevated troponin chronically due to troponin leak as she is on end-stage renal disease. Consider dialyzed tomorrow:  We are going to put a nephrology consult. Thoracentesis offered to the patient. The patient at this moment refuses. She states that she will like to think about it. The risks and benefits were explained to the patient.  4.  Multiple myeloma: This is stable.  5.  Thrombocytopenia:    At this moment, we are not going to use heparin for DVT prophylaxis. We are only going to use mechanical compression devices.  6.  Gastrointestinal prophylaxis with Protonix.  7.  Hyperglycemia due to multiple myeloma is stable.  8.  Hyperproteinuria and hyperproteinemia:  Also due to multiple myeloma, stable.  9.  Elevated troponin due to troponin leak:  No signs of acute coronary syndrome.  10.  Tachycardia due to septic process:  The patient cannot be on IV fluids due to her end-stage renal disease and fluid overload. We are going to just start antibiotics.  11.  Tachycardia due  septic process.  12.  Systemic inflammatory response syndrome:  The patient is with tachycardia, tachypnea and a fever of 101.  13:  Broad-spectrum antibiotics started.  This is likely due to HCAP.  CODE STATUS:  The patient is a FULL CODE.   TIME SPENT:   About  50 minutes with this patient today.   ____________________________ Duran Sink, MD rsg:cb D: 10/14/2012 21:05:27 ET T: 10/14/2012 22:04:43 ET JOB#: [id number]  cc: Glidden Sink, MD, <Dictator> Vonzell Lindblad America Brown MD ELECTRONICALLY SIGNED 10/26/2012 1:55

## 2014-09-09 NOTE — Discharge Summary (Signed)
PATIENT NAME:  Paula Stone, Paula Stone MR#:  [id number] DATE OF BIRTH:  03-28-1946  DATE OF ADMISSION:  10/05/2012 DATE OF DISCHARGE:  10/06/2012  ADMISSION DIAGNOSES:  1.  Back pain and chest pain.  2.  Elevated troponin.  3.  End-stage renal disease, on hemodialysis.   DISCHARGE DIAGNOSES: 1.  Back pain and chest pain, with possible pneumonia.  2. Possible community-acquired pneumonia.  3.  Elevated troponin, secondary to poor renal clearance.  4.  End-stage renal on hemodialysis. 5.  History of multiple myeloma.   CONSULTS:  1.  Dr. Nehemiah Massed.  2.  Dr. Grayland Ormond.  3.  Dr. Candiss Norse.   LABORATORIES AT DISCHARGE: Sodium 139, potassium 3.7, chloride 100, bicarb 30, BUN 22, creatinine 6.60, glucose 91.   Chest x-ray shows possible pneumonia.   Troponin at discharge 0.16.   Blood cultures are negative to-date.   SPEP is pending.   HOSPITAL COURSE: This is a 69 year old female who presented with vague symptoms of chest pain and back pain.   For further details, please refer to the H and P.    1.  Chest pain and back pain: I think that her back pain is likely musculoskeletal in nature. She could have possibly a pneumonia, so therefore was started on Levaquin. As far as her chest pain is concerned, she did have slightly elevated troponins, however this was secondary to poor renal clearance and not a non-ST elevation MI. She was discharged with Flexeril and Percocet. Once again, more likely that her pain was musculoskeletal in nature.  2. Elevated troponins, secondary to poor renal clearance: Appreciate Dr. Alveria Apley consult. No further cardiac workup at this time.  3. End-stage renal disease, on hemodialysis: The patient did have acute dialysis while in the hospital. Appreciate Dr. Keturah Barre consult.  4.  History of multiple myeloma: Dr. Grayland Ormond did see the patient in consultation. He did not feel like her symptoms were secondary to her multiple myeloma. At this time he also did not feel that the  patient needed to restart any kind of chemotherapy. Her most recent M spike was 0.4, and as per Dr. Grayland Ormond no intervention at this time. She will follow up with Dr. Grayland Ormond.   DISCHARGE MEDICATIONS: 1.  Sensipar 30 mg daily with largest meal.  2.  Norvasc 5 mg daily.  3.  Ventolin HFA, 2 puffs 4 times a day p.r.n.  4.  Acetaminophen/oxycodone, 325/5, 1 tablet q.6h. p.r.n. pain, #20.  5.  Levaquin 500 mg x 48 hours,  x 3 days.  6.  Flexeril 5 mg b.i.d. x 5 days.   DISCHARGE DIET: Renal diet.   DISCHARGE ACTIVITY: As tolerated.   DISCHARGE FOLLOWUP: The patient will follow up with Dr. Candiss Norse is scheduled for dialysis, Dr. Delight Hoh in 1 to 2 weeks as already scheduled.   Dr. Kingsley Spittle in 1 week.   Time spent: Approximately 35 minutes.   ____________________________ Donell Beers. Benjie Karvonen, MD spm:dm D: 10/06/2012 14:27:02 ET T: 10/06/2012 15:29:38 ET JOB#: [id number]  cc: Denis Koppel P. Benjie Karvonen, MD, <Dictator> Ellamae Sia, MD Kathlene November. Grayland Ormond, MD Donell Beers Keiley Levey MD ELECTRONICALLY SIGNED 10/07/2012 12:26

## 2014-09-09 NOTE — Consult Note (Signed)
History of Present Illness:   Reason for Consult multiple myeloma on Velcade.    HPI   Patient admitted to the hospital over the weekend with worsening shortness of breath and was found to have a CHF exacerbation.  Currently she feels significantly improved, but does admit to occasional wheezing. She continues to have back and rib pain, which is unchanged.  She has occasional nausea. She has no neurologic complaints. She has a good appetite and has maintained her weight.  She denies any recent fevers or illnesses.  She has no vomiting, constipation, or diarrhea.  She has no urinary complaints. Patient offers no further specific complaints today.  PFSH:   Additional Past Medical and Surgical History Past medical history: End-stage renal disease on dialysis, hypertension, gout, diabetes, DJD.  Past surgical history: Left arm AV shunt placement.  Family history: Negative and noncontributory.  Social history: Patient denies tobacco or alcohol.   Review of Systems:   Performance Status (ECOG) 2    Review of Systems   As per HPI. Otherwise, 10 point system review was negative.  NURSING NOTES: **Vital Signs.:   03-Feb-14 11:50    Vital Signs Type: Routine    Temperature Temperature (F): 99.4    Celsius: 37.4    Temperature Source: oral    Pulse Pulse: 96    Respirations Respirations: 18    Systolic BP Systolic BP: 638    Diastolic BP (mmHg) Diastolic BP (mmHg): 86    Mean BP: 108    Pulse Ox % Pulse Ox %: 91    Pulse Ox Activity Level: At rest    Oxygen Delivery: Room Air/ 21 %   Physical Exam:   Physical Exam General: Well-developed, well-nourished, no acute distress. Eyes: Pink conjunctiva, anicteric sclera. HEENT: Normocephalic, moist mucous membranes, clear oropharnyx. Lungs: Clear to auscultation bilaterally. Heart: Regular rate and rhythm. No rubs, murmurs, or gallops. Abdomen: Soft, nontender, nondistended. No organomegaly noted, normoactive bowel  sounds. Musculoskeletal: No edema, cyanosis, or clubbing. Neuro: Alert, answering all questions appropriately. Cranial nerves grossly intact. Skin: No rashes or petechiae noted. Psych: Normal affect. Lymphatics: No cervical, calvicular, axillary or inguinal LAD.    No Known Allergies:     predniSONE 10 mg oral tablet: Start at 60 mg and taper by 10 mg daily until complete, Active, 21, None   levofloxacin 250 mg oral tablet: 1 tab(s) orally every other day, Active, 3, None   albuterol CFC free 90 mcg/inh inhalation aerosol: 2 puff(s) inhaled 4 times a day, As Needed, Active, 80, None   lisinopril 40 mg oral tablet: 1 tab(s) orally once a day, Active, 30, None   oxyCODONE 10 mg oral tablet: 1-2 tab(s) orally every 6 hours as needed for pain., Active, 120, None   Compazine tablet 10 mg: 1 tab(s) orally every 6 hours x 30 days as needed  , Active, 120, 5   calcium acetate 667 mg oral capsule: 2 cap(s) orally 3 times a day (with meals). , Active, 0, None   allopurinol 100 mg oral tablet: 1 tab(s) orally 2 times a day as needed for gout., Active, 0, None  Laboratory Results: Routine Chem:  03-Feb-14 00:15    Glucose, Serum  173   BUN  43   Creatinine (comp)  7.20   Sodium, Serum 137   Potassium, Serum 3.6   Chloride, Serum 98   CO2, Serum 24   Calcium (Total), Serum  7.2   Anion Gap 15   Osmolality (calc) 289  eGFR (African American)  6   eGFR (Non-African American)  5 (eGFR values <45m/min/1.73 m2 may be an indication of chronic kidney disease (CKD). Calculated eGFR is useful in patients with stable renal function. The eGFR calculation will not be reliable in acutely ill patients when serum creatinine is changing rapidly. It is not useful in  patients on dialysis. The eGFR calculation may not be applicable to patients at the low and high extremes of body sizes, pregnant women, and vegetarians.)  Routine Hem:  03-Feb-14 00:15    WBC (CBC) 8.9   RBC (CBC)  3.78    Hemoglobin (CBC)  11.7   Hematocrit (CBC) 37.4   Platelet Count (CBC)  83   MCV 99   MCH 31.0   MCHC  31.3   RDW  20.5   Neutrophil % 91.3   Lymphocyte % 5.0   Monocyte % 3.5   Eosinophil % 0.0   Basophil % 0.2   Neutrophil #  8.1   Lymphocyte #  0.4   Monocyte # 0.3   Eosinophil # 0.0   Basophil # 0.0 (Result(s) reported on 22 Jun 2012 at 01:00AM.)   Assessment and Plan:  Impression:   Recurrent multiple myeloma.  Plan:   1.  Multiple myeloma: Patient's most recent M-spike has decreased to 0.6. Patient is scheduled for next injection of Velcade today and this has been ordered. Velcade has been dose reduced secondary to her thrombocytopenia.   Patient cannot receive Zometa or Xgeva given her hypocalcemia.  patient has been instructed [facility] then in approximately 2 weeks for routine followup.  Anemia: Patient continues to receive Procrit at dialysis. Thrombocytopenia: Secondary to chemotherapy as well as multiple myeloma.  Proceed with Velcade as above. Back pain: Secondary to multiple myeloma lesions.  Patient has refused XRT.  Cannot use Zometa or Xgeva given patient's hypocalcemia.  Continue oxycodone to 10-20 mg as needed. consult, call questions.  Electronic Signatures: FDelight Hoh(MD)  (Signed 03-Feb-14 13:20)  Authored: HISTORY OF PRESENT ILLNESS, PFSH, ROS, NURSING NOTES, PE, ALLERGIES, HOME MEDICATIONS, LABS, ASSESSMENT AND PLAN   Last Updated: 03-Feb-14 13:20 by FDelight Hoh(MD)

## 2014-09-09 NOTE — Consult Note (Signed)
History of Present Illness:  Reason for Consult Multiple myeloma, patient last received chemotherapy in February 2014.   HPI   Patient is a 69 year old female with a long-standing history of multiple myeloma who last received chemotherapy in February 2014.  She also is on dialysis secondary to myeloma kidney.  Patient presented the hospital with increasing chest pain and shortness of breath.  She had elevated troponins and was found to be in CHF.  Currently, her status feels unchanged since admission.  She continues to have chest pain and shortness of breath.  She has no neurologic complaints.  She denies any nausea, vomiting, constipation, or diarrhea.  Patient feels generally terrible, but offers no further specific complaints.  PFSH:  Additional Past Medical and Surgical History End-stage renal disease on dialysis, hypertension, gout, diabetes, DJD.  Left arm AV shunt placement.  Family history: Negative and noncontributory.  Social history: Patient denies tobacco or alcohol.   Review of Systems:  Performance Status (ECOG) 2   Review of Systems   As per HPI. Otherwise, 10 point system review was negative.  NURSING NOTES: **Vital Signs.:   19-May-14 07:58   Vital Signs Type: Routine   Temperature Temperature (F): 97.6   Celsius: 36.4   Temperature Source: oral   Pulse Pulse: 84   Respirations Respirations: 21   Systolic BP Systolic BP: 828   Diastolic BP (mmHg) Diastolic BP (mmHg): 82   Mean BP: 106   Pulse Ox % Pulse Ox %: 100   Oxygen Delivery: 2L; Nasal Cannula   Pulse Ox Heart Rate: 84   Physical Exam:  Physical Exam General: Well-developed, well-nourished, no acute distress. Eyes: Pink conjunctiva, anicteric sclera. HEENT: Normocephalic, moist mucous membranes, clear oropharnyx. Lungs: diminished. Heart: Regular rate and rhythm. No rubs, murmurs, or gallops. Abdomen: Soft, nontender, nondistended. No organomegaly noted, normoactive bowel  sounds. Musculoskeletal: No edema, cyanosis, or clubbing. Neuro: Alert, answering all questions appropriately. Cranial nerves grossly intact. Skin: No rashes or petechiae noted. Psych: Normal affect.    No Known Allergies:     Sensipar 30 mg oral tablet: 1 tab(s) orally once a day with largest meal., Status: Active, Quantity: 0, Refills: None   amLODIPine 5 mg oral tablet: 1 tab(s) orally once a day, Status: Active, Quantity: 0, Refills: None   Ventolin HFA CFC free 90 mcg/inh inhalation aerosol: 2 puff(s) inhaled 4 times a day as needed for shortness of breath., Status: Active, Quantity: 0, Refills: None  Laboratory Results: Hepatic:  18-May-14 22:07   Bilirubin, Total 0.3  Alkaline Phosphatase 57  SGPT (ALT)  8  SGOT (AST) 30  Total Protein, Serum  9.2  Albumin, Serum  2.7  Routine Chem:  18-May-14 22:07   Result Comment TROPONIN - RESULTS VERIFIED BY REPEAT TESTING.  - CALLED TO BRANDY DAVIS:10/04/12 AT 2246  - READ-BACK PROCESS PERFORMED.  - TPL  Result(s) reported on 04 Oct 2012 at 10:47PM.  Glucose, Serum 86  BUN  41  Creatinine (comp)  9.89  Sodium, Serum 139  Potassium, Serum 3.5  Chloride, Serum 103  CO2, Serum 26  Calcium (Total), Serum  12.0  Osmolality (calc) 287  eGFR (African American)  4  eGFR (Non-African American)  4 (eGFR values <33m/min/1.73 m2 may be an indication of chronic kidney disease (CKD). Calculated eGFR is useful in patients with stable renal function. The eGFR calculation will not be reliable in acutely ill patients when serum creatinine is changing rapidly. It is not useful in  patients on dialysis. The  eGFR calculation may not be applicable to patients at the low and high extremes of body sizes, pregnant women, and vegetarians.)  Anion Gap 10  Cardiac:  18-May-14 22:07   Troponin I  0.20 (0.00-0.05 0.05 ng/mL or less: NEGATIVE  Repeat testing in 3-6 hrs  if clinically indicated. >0.05 ng/mL: POTENTIAL  MYOCARDIAL INJURY.  Repeat  testing in 3-6 hrs if  clinically indicated. NOTE: An increase or decrease  of 30% or more on serial  testing suggests a  clinically important change)   Assessment and Plan: Impression:   History of multiple myeloma, now with CHF exacerbation. Plan:   1.  Multiple myeloma: Patient's most recent M spike was 0.4.  She has not received any of Velcade since February 2014.  Cannot use Zometa since she is on dialysis.  No intervention is needed at this time.  Patient has been instructed to keep her previously scheduled followup appointments. Anemia: Hemoglobin is proximal her baseline, continue Procrit while dialysis. CHF: Treatment per cardiology. consult, will follow.   Electronic Signatures: Delight Hoh (MD)  (Signed 19-May-14 15:58)  Authored: HISTORY OF PRESENT ILLNESS, PFSH, ROS, NURSING NOTES, PE, ALLERGIES, HOME MEDICATIONS, LABS, ASSESSMENT AND PLAN   Last Updated: 19-May-14 15:58 by Delight Hoh (MD)

## 2014-09-09 NOTE — Consult Note (Signed)
History of Present Illness:  Reason for Consult Multiple myeloma, now with malignant pleural effusion.   HPI   Patient initially admitted with increasing shortness of breath.  Thoracentesis several days ago revealed a malignant pleural effusion with plasma cells consistent with multiple myeloma.  currently, patient offers no complaints.  She has a flat affect and minimally conversive. She has no neurologic complaints.  She has no chest pain or cough.  She has a poor appetite. She denies any recent fevers or illnesses.  She has no urinary complaints. Patient offers no further specific complaints today.  PFSH:  Additional Past Medical and Surgical History End-stage renal disease on dialysis, hypertension, gout, diabetes, DJD.  Left arm AV shunt placement.  Family history: Negative and noncontributory.  Social history: Patient denies tobacco or alcohol.   Review of Systems:  Performance Status (ECOG) 2   Review of Systems   As per HPI. Otherwise, 10 point system review was negative.  NURSING NOTES: **Vital Signs.:   05-Jun-14 14:02   Vital Signs Type: Q 4hr   Temperature Temperature (F): 99.2   Celsius: 37.3   Temperature Source: oral   Pulse Pulse: 103   Respirations Respirations: 16   Systolic BP Systolic BP: 903   Diastolic BP (mmHg) Diastolic BP (mmHg): 63   Mean BP: 78   Pulse Ox % Pulse Ox %: 94   Pulse Ox Activity Level: At rest   Oxygen Delivery: 1L   Physical Exam:  Physical Exam General: Well-developed, well-nourished, no acute distress. Eyes: Pink conjunctiva, anicteric sclera. HEENT: Normocephalic, moist mucous membranes, clear oropharnyx. Lungs: Clear to auscultation bilaterally. Heart: Regular rate and rhythm. No rubs, murmurs, or gallops. Abdomen: Soft, nontender, nondistended. No organomegaly noted, normoactive bowel sounds. Musculoskeletal: No edema, cyanosis, or clubbing. Neuro: Alert, answering all questions appropriately. Cranial nerves  grossly intact. Skin: No rashes or petechiae noted. Psych: flat affect.    No Known Allergies:     Sensipar 30 mg oral tablet: 1 tab(s) orally once a day with largest meal., Status: Active, Quantity: 0, Refills: None   amLODIPine 5 mg oral tablet: 1 tab(s) orally once a day, Status: Active, Quantity: 0, Refills: None   Ventolin HFA CFC free 90 mcg/inh inhalation aerosol: 2 puff(s) inhaled 4 times a day as needed for shortness of breath., Status: Active, Quantity: 0, Refills: None  Laboratory Results: Routine Hem:  01-Jun-14 05:06   WBC (CBC) 9.9  RBC (CBC)  2.83  Hemoglobin (CBC)  8.7  Hematocrit (CBC)  27.6  Platelet Count (CBC)  96  MCV 97  MCH 30.7  MCHC  31.5  RDW  17.9  Neutrophil % 67.4  Lymphocyte % 16.3  Monocyte % 14.9  Eosinophil % 1.1  Basophil % 0.3  Neutrophil #  6.7  Lymphocyte # 1.6  Monocyte #  1.5  Eosinophil # 0.1  Basophil # 0.0 (Result(s) reported on 18 Oct 2012 at 06:10AM.)   Assessment and Plan: Impression:   Recurrent multiple myeloma with malignant pleural effusion. Plan:   1.  Multiple myeloma: Patient's most recent M-spike has increased to 1.3.   Given her new malignant pleural effusion consistent with multiple myeloma, I have recommended reinitiating treatments using Revlimid or Velcade.  Patient cannot receive Zometa secondary to her end-stage renal disease. Have also discuss case with patient's initial myeloma physician at [facility], Dr. Terrilyn Saver.  Plan to set up a followup appointment with him upon discharge.  Appreciate palliative care input. Anemia: Patient continues to receive Procrit at  dialysis. Thrombocytopenia: Likely secondary to her recurrent myeloma.  No intervention is needed, treatment as above.  Monitor. follow.  Electronic Signatures: Delight Hoh (MD)  (Signed 05-Jun-14 16:03)  Authored: HISTORY OF PRESENT ILLNESS, PFSH, ROS, NURSING NOTES, PE, ALLERGIES, HOME MEDICATIONS, LABS, ASSESSMENT AND PLAN   Last Updated:  05-Jun-14 16:03 by Delight Hoh (MD)

## 2014-09-09 NOTE — H&P (Signed)
PATIENT NAME:  Paula Stone, Paula Stone MR#:  [id number] DATE OF BIRTH:  10/14/1945  DATE OF ADMISSION:  11/04/2012  PRIMARY CARE PHYSICIAN: Dr. Harriett Burns.   ONCOLOGIST: Dr. Finnegan.   CHIEF COMPLAINT: Intractable pain.   HISTORY OF PRESENT ILLNESS: Ms. Mahadeo is a pleasant, 69-year-old, African American female with a past medical history of multiple myeloma with malignant pleural effusion, recently underwent thoracentesis and pleural fluid positive for myeloma cells indicating advanced multiple myeloma, along with history of hypoxia due to pleural effusion. Comes into the Emergency Room with intractable pain, poor appetite, lethargy and overall declining in the last few days. The patient has extended family present in the Emergency Room including her daughter, Jennifer. Per daughter, the patient, since discharge June 9th, has been progressively declining to the point she is not able to get around, do her ADLs either and has poor p.o. intake, is very weak and has a lot of pain. She used to take oxycodone; however, discharge summary from the last admission did not have any pain medications other than Tylenol. The patient, in the Emergency Room, appears very frail, weak and quite lethargic, answers a few questions appropriately. She was found to have a hemoglobin of 6, was hypotensive with a systolic in the 70s. She received some IV fluids. Systolic blood pressure went up to 86. She is being admitted for management of intractable pain and palliative care consultation for further goals of treatment.   PAST MEDICAL HISTORY:  1. Advanced multiple myeloma.  2. Respiratory failure, chronic, secondary to malignant pleural effusion which appears to be due to multiple myeloma.  3. Chronic thrombocytopenia.  4. End-stage renal disease, on hemodialysis.  5. Anemia of chronic disease.  6. Gout.  7. History of hypertension.   HOME MEDICATIONS:  1. Sensipar 30 mg p.o. daily.  2. Renvela 800 mg 2 tablets 3 times a  day.  3. Quinapril 20 mg daily at bedtime.  4. ProAir HFA 90 mcg 2 puffs inhalation 4 times a day.  5. Losartan 100 mg at bedtime.  6. Lisinopril 40 mg daily.  7. Cyclobenzaprine 5 mg 1 tablet every 8 hours.  8. Amlodipine 5 mg p.o. daily.   REVIEW OF SYSTEMS: Not obtainable much. The patient is very weak and on and off becomes lethargic.    SOCIAL HISTORY: According to daughter, the patient lives at home by herself. Hospice follows up with the patient. She is a nonsmoker.   PAST SURGICAL HISTORY: AV fistula placement.   FAMILY HISTORY: According to old records, diabetes in grandmother and some type of cancer in mother.   ALLERGIES: No known drug allergies.   PHYSICAL EXAMINATION:  GENERAL: The patient appears very frail and cachectic.  VITAL SIGNS: Temperature is 98.6. Pulse is 96. Blood pressure is 86/53. Sat is 100% on 2 liters.   HEENT: Atraumatic, normocephalic. Pallor conjunctivae. PERRLA. Oral mucosa is moist.  NECK: Supple. No JVD. No carotid bruit.  RESPIRATORY: Clear to auscultation bilaterally. There are decreased breath sounds at the bases. No respiratory distress or use of accessory muscles.  CARDIOVASCULAR: Both of the heart sounds are normal. Rate, rhythm regular. PMI nonlateralized. Chest nontender.  ABDOMEN: Scaphoid. No organomegaly noted. She has feeble bowel sounds.  EXTREMITIES: Bilateral lower extremities no edema, feeble pedal pulses, good femoral pulses.  NEUROLOGIC: Unable to assess much, but the patient moves all of her extremities spontaneously. She is on and off lethargic.  SKIN: Warm and dry. Generalized pallor present.   LABS: Chest x-ray shows   findings secondary to interstitial pulmonary edema. Small right pleural effusion, decrease from prior.   White count is 5.4. H and H is 6.6 and 20.1. MCV is 94. Glucose is 85. BUN is 12. Creatinine is 3.5. Sodium is 135. Potassium is 3. Chloride is 95. Calcium is 11.   ASSESSMENT AND PLAN: Viktorya Arguijo is a  69 year old with a history of advanced multiple myeloma, chronic respiratory failure secondary to malignant pleural effusion. Comes in with:  1. Acute on chronic respiratory failure secondary to malignant pleural effusion due to multiple myeloma. The patient is status post thoracentesis the early part of June. Currently, her saturations are 100% on 2 liters. Her chest x-ray does not show much fluid overload. She is more getting fatigued and short of breath secondary to her underlying malignant pleural effusion and anemia.  2. End-stage renal disease: The patient is currently on hemodialysis. We will hold off on dialysis for now since the patient's family is considering comfort measures and hospice.  3. Anemia of chronic disease: Hemoglobin is down to 6. The patient's family does not want blood transfusion.  4. Multiple myeloma, advanced disease: The patient follows with Dr. Grayland Ormond and at [facility].  5. Thrombocytopenia: Chronic due to multiple myeloma.  6. I had a lengthy discussion with extended family members including  patient's daughter, Anderson Malta, and her sister in the Emergency Room. The patient is declining very rapidly. The patient finds pain to be under control, and they do not wish any aggressive workup, including blood transfusion. They understand the patient is not doing well. The patient's family is requesting palliative care and are inclined towards hospice home. Code status discussed with daughter, Anderson Malta, and sister. They request DO NOT RESUSCITATE. Will have palliative care, Dr. Ermalinda Memos, see the patient tomorrow. In the meantime, we will continue comfort measures with intravenous morphine, continue intravenous fluids and place a DO NOT RESUSCITATE order.   TIME SPENT: 50 minutes.    ____________________________ Hart Rochester Posey Pronto, MD sap:gb D: 11/04/2012 21:55:40 ET T: 11/04/2012 22:22:40 ET JOB#: [id number]  cc: Cormick Moss A. Posey Pronto, MD, <Dictator> Ellamae Sia,  MD Kathlene November. Grayland Ormond, MD Ilda Basset MD ELECTRONICALLY SIGNED 11/11/2012 14:28

## 2014-09-09 NOTE — H&P (Signed)
PATIENT NAME:  Paula Stone, Paula Stone MR#:  [id number] DATE OF BIRTH:  08-22-1945  DATE OF ADMISSION:  10/05/2012  PRIMARY CARE PHYSICIAN: Harriett Burns.   ONCOLOGIST: Dr. Grayland Ormond.   NEPHROLOGIST: Dr. Candiss Norse.   REFERRING PHYSICIAN: Lavonia Drafts.   CHIEF COMPLAINT: Shortness of breath and generalized aching pains.   HISTORY OF PRESENT ILLNESS: Paula Stone is a 69 year old African American female with a history of multiple myeloma, end-stage renal disease on hemodialysis. Was in her usual state of health until this weekend for the last 2 days. She is experiencing generalized myalgias, body aching pains all over, hurting to turn over in bed, associated with shortness of breath and some exertional dyspnea. She also described cough; however, I understood that the cough is chronic for months. Along with that, the patient tells me she has nausea and a few episodes of vomiting. She indicates that the vomiting was only phlegm. She is also gagging. Denies chest pain; however, she has pains all over, especially in her shoulders and the back and the ribs. Evaluation here at the Emergency Department reveals elevated troponin reaching 0.2. Going back through her records, in February her troponin was also elevated but was reaching 0.1. Today, her chest x-ray showed pulmonary vascular congestion and bilateral pleural effusion, more so on the right side. The patient now is in the process to be admitted to the hospital for further evaluation of her findings.   REVIEW OF SYSTEMS:   CONSTITUTIONAL: Denies any fever. No chills, but she feels fatigued or generalized fatigue and aching pains all over.  EYES: No blurring of vision. No double vision.  EARS, NOSE, THROAT: No hearing impairment. No sore throat. No dysphagia.  CARDIOVASCULAR: No chest pain, but she has shortness of breath. No edema. No syncope.  RESPIRATORY: She has shortness of breath, mostly exertional. She has cough. No hemoptysis.  GASTROINTESTINAL: No  abdominal pain, but she has nausea and occasional vomiting. No hematemesis. No melena or hematochezia.  GENITOURINARY: No dysuria or frequency of urination.  MUSCULOSKELETAL: Apart from the generalized aching pains, she has no specific joint pain or swelling. The patient has generalized muscular pains. No swelling.  INTEGUMENTARY: No skin rash. No ulcers.  NEUROLOGY: No focal weakness. No seizure activity. No headache.  PSYCHIATRY: No anxiety. No depression.  ENDOCRINE: No polyuria or polydipsia. No heat or cold intolerance.   PAST MEDICAL HISTORY: Multiple myeloma, end-stage renal disease on hemodialysis on Monday, Wednesday and Friday, severe systemic hypertension, gout, degenerative joint disease, chronic anemia and thrombocytopenia.   PAST SURGICAL HISTORY: AV fistula or shunt formation.   FAMILY HISTORY: Negative for end-stage renal disease and premature coronary artery disease.   SOCIAL HABITS: Nonsmoker. No history of alcohol or drug abuse.   SOCIAL HISTORY: She is separated from her husband. She lives alone.   ADMISSION MEDICATIONS: Amlodipine 5 mg a day, Sensipar 30 mg once a day, Ventolin inhaler p.r.n.    ALLERGIES: No known drug allergies.   PHYSICAL EXAMINATION:  VITAL SIGNS: Blood pressure 189/92, respiratory rate 26, pulse 108, temperature 99, oxygen saturation was 100%. The patient is on oxygen here.  GENERAL APPEARANCE: Elderly female lying on her side sleeping. She appears lethargic but in no acute distress.  HEAD AND NECK: No pallor. No icterus. No cyanosis. Ear examination revealed normal hearing, no discharge, no ulcers. Examination of the nose showed no discharge, no ulcers. Oropharyngeal examination revealed normal lips and tongue. No ulcers. No oral thrush. Eye examination revealed normal eyelids and conjunctivae. Pupils about  5 to 6 mm, round, equal and reactive to light. Neck is supple. Trachea at midline. No thyromegaly. No cervical lymphadenopathies.  HEART:  Normal S1, S2. No S3 or S4. No murmur. No gallop.  RESPIRATORY: Normal breathing pattern at the time of my examination. She was tachypneic earlier. Decreased breathing sounds bilaterally. A few fine rales heard at right lung mid zone.  ABDOMEN: Soft without tenderness. No hepatosplenomegaly. No masses.  MUSCULOSKELETAL: No joint swelling. No clubbing. No peripheral edema.  SKIN: No ulcers. No subcutaneous nodules  NEUROLOGIC: Cranial nerves II through XII were intact. No focal motor deficit.  PSYCHIATRIC: The patient is alert and oriented x3. Mood and affect were normal.   LABORATORY FINDINGS: EKG showed sinus tachycardia at rate of 106 per minute. Left ventricular hypertrophy and nonspecific T wave abnormalities. Chest x-ray showed mild cardiomegaly. Bilateral pleural effusion, more so on the right than the left. Evidence of pulmonary vascular congestion as well. Possibly early pulmonary edema. Her blood workup showed a glucose of 86, BUN 41, creatinine 9.8, sodium 139, potassium 3.5, calcium 12. Total protein 9.2 with albumin of 2.7. Normal liver transaminases. Troponin is elevated at 0.2. CBC showed white count of 7000, hemoglobin 9, hematocrit 28, platelet count 143.   ASSESSMENT:  1. Elevated troponin, suspicious for non-ST elevation myocardial infarction.  2. Dyspnea.  3. New development of bilateral pleural effusion, more on the right than the left.  4. Pulmonary vascular congestion and evidence of congestive heart failure, likely to be systolic dysfunction. Awaiting further evaluation with echocardiogram.  5. End-stage renal disease, on hemodialysis.  6. Multiple myeloma.  7. Generalized myalgias.  8. Anemia and thrombocytopenia which is chronic.  9. Mild hypercalcemia.  10. Systemic hypertension.  11. History of gout and degenerative osteoarthritis.   PLAN: Will admit to the intensive care unit. Follow up on the troponin. Oxygen supplementation. Add aspirin and beta blocker. Consult  nephrology for hemodialysis and also to reduce volume overload. Obtain echocardiogram. Cardiology consultation. Nephrology consultation and also oncology consultation with Dr. Grayland Ormond since he is familiar with her condition. Check BNP or B-type natriuretic peptide. Continue home medications as listed above. The patient indicates that she has a Living Will. Her code status is FULL CODE unless she has irreversible condition and she is in a vegetative state, then she does not want to be resuscitated indefinitely.   Time Spent in evaluating this patient and reviewing her medical records took more than 1 hour.   For deep vein thrombosis prophylaxis, I placed her on TED stockings and pneumatic compression hose.    ____________________________ Clovis Pu. Lenore Manner, MD amd:gb D: 10/05/2012 01:48:13 ET T: 10/05/2012 04:20:10 ET JOB#: [id number]  cc: Clovis Pu. Lenore Manner, MD, <Dictator> Ellin Saba MD ELECTRONICALLY SIGNED 10/11/2012 23:31

## 2014-09-09 NOTE — Consult Note (Signed)
Brief Consult Note: Diagnosis: Right pleural effusion.   Patient was seen by consultant.   Consult note dictated.   Comments: Recommend an image guided thoracentesis as initial treatment.  Electronic Signatures: Jasmine Decemberaks, Karianne Nogueira E (MD)  (Signed 02-Jun-14 16:53)  Authored: Brief Consult Note   Last Updated: 02-Jun-14 16:53 by Jasmine Decemberaks, Jalynn Waddell E (MD)

## 2014-09-09 NOTE — Discharge Summary (Signed)
PATIENT NAME:  Paula Stone, Paula Stone MR#:  [id number] DATE OF BIRTH:  11-21-1945  DATE OF ADMISSION:  10/14/2012 DATE OF DISCHARGE:  10/23/2012  DISCHARGE DIAGNOSES: 1.  Acute respiratory failure due to right-sided pneumonia and effusion, status post thoracentesis, with fluid growing malignant pleural effusion consistent with multiple myeloma recommended. Follow-up with [facility], Dr. Terrilyn Saver.  2.  Hypoxia, due to underlying pleural effusion, with possible chronic respiratory failure, with oxygen saturations dropping in 70's requiring 1 liter oxygen to maintain saturations more than 95%. 3. SIRS present on admit related to pneumonia and malignant pleural effusion/MM   SECONDARY DIAGNOSES: 1.  Multiple myeloma.  2.  Chronic thrombocytopenia.  3.  End-stage renal disease, on hemodialysis.  4.  Hypertension.  5.  Gout.  6.  Degenerative joint disease.  7.  Anemia of chronic disease.   CONSULTATIONS: 1.  Nephrology: Dr. Candiss Norse.  2.  Palliative care.  3.  Surgery: Dr. Genevive Bi.  4.  Pulmonary: Dr. Raul Del.  5. Oncology: Dr. Grayland Ormond.   PROCEDURES/RADIOLOGY: Chest x-ray on the 28th of May showed worsening opacification of the right hemithorax, suggestive of progressive underlying pneumonia or progression of atelectasis. Increased right pleural effusion. Diffuse interstitial edema. Mild cardiomegaly.   Chest x-ray on the 31st of May showed persistent significant opacification of the right hemithorax, without significant interval change. Trace left lung basilar atelectasis with minimal left-sided pleural effusion.   Chest x-ray, status post thoracentesis on 3rd of June showed no evidence of pneumothorax, status post ultrasound-guided thoracentesis.   CT scan of the chest, abdomen and pelvis with contrast on 2nd of June showed new large, right-sided pleural effusion. Nearly total atelectatic right lung. Left basilar atelectasis versus developing pneumonia. No significant left-sided pleural effusion. Mild  enlargement of the cardiac chambers. Multiple lesions within the ribs bilaterally, as well as thoracic vertebral bodies worrisome for a known myeloma.   Possible ileus in the small bowel, or partial distal obstruction. Small ventral hernia which does contain a small loop of bowel incarceration. No evidence of colitis or diverticulitis. No acute hepatobiliary abnormality. Atrophic kidneys. Mild loss of height of the body of L1. Heterogeneous mineralization of the lumbar vertebral bodies consistent with known myeloma.   Ultrasound-guided thoracentesis on the right without any immediate complication, and removal of 1 liter of fluid.   MAJOR LABORATORY PANEL: Blood cultures x 2 were negative on the 28th of May.   Intact PTH was within normal limits, with a value of 54 on the 29th of May.   Pleural fluid did not grow any bacteria, and many WBCs.   Pleural fluid smear revealed involvement of plasma cell myeloma per pathologist.   Clovis COURSE: The patient is a 69 year old female with the above-mentioned medical problems who was admitted for fever with increasing shortness of breath. Was found to have significant right-sided pleural effusion with acute respiratory failure. Please see Dr. Olene Craven dictated history and physical for further details.   The patient was evaluated by nephrology, Dr. Holley Raring, for dialysis need. Pulmonary consultation was obtained with Dr. Raul Del, who recommended ultrasound-guided thoracentesis for diagnostic and therapeutic purposes. The patient was also evaluated by surgery, Dr. Genevive Bi, who also agreed with ultrasound-guided thoracentesis which was performed on the 1st of June, with removal of 1 liter of fluid. The fluid was consistent with plasma cell myeloma, for which oncology consultation was obtained with Dr. Grayland Ormond, who communicated with the patient's outpatient myeloma physician, Dr. Terrilyn Saver at [facility], who was agreeable to follow up  with the  patient as this was likely worrisome for myeloma pleural fluid.   The patient was evaluated by palliative care, Dr. Ermalinda Memos, and the patient stated wanting to have FULL CODE for long-term care. The patient was feeling much better by the 6th of June, although she was still requiring a significant amount of oxygen as her oxygen saturation dropped in the mid-70s, for which she was placed back on 1 liter oxygen and was discharged home in stable condition.   PHYSICAL EXAMINATION: VITAL SIGNS: On the date of discharge her vital signs were as follows: Temperature 98.6, heart rate 100 per minute, respirations 18 per minute, blood pressure 114/66. She was saturating 97% to 95% on 1 liter oxygen via nasal cannula, and was saturating 76%.  Pertinent physical examination on the day of discharge:   CARDIOVASCULAR: S1, S2 normal. No murmur, rub or gallop.  LUNGS: Clear to auscultation bilaterally. No wheezing, rales, rhonchi, or crepitation.  ABDOMEN: Soft, benign.  NEUROLOGIC: Nonfocal examination.   All other physical examination remained at baseline.   DISCHARGE MEDICATIONS: 1.  Sensipar 30 mg once daily.  2.  Amlodipine 5 mg p.o. daily.  3.  Ventolin HFA 2 puffs inhaled 4 times a day as needed.  4.  Levaquin 500 mg p.o. every 48 hours, to finish the course.  5.  Sevelamer 1600 milligram(s) orally 3 times a day (with meals)  .  6.  Tylenol 650 mg p.o. every 4 hours as needed.   DISCHARGE DIET: Low fat, low cholesterol, renal diet.   DISCHARGE ACTIVITY: As tolerated.   DISCHARGE INSTRUCTIONS AND FOLLOWUP: The patient was instructed to follow up with her primary care physician at Dr. Ellin Goodie' office in 1 to 2 weeks. She will need follow-up with [facility] Dr. Terrilyn Saver, in 2 to 4 weeks, and Dr. Juleen China from nephrology in 4 to 6 weeks. She will get hemodialysis as scheduled, as an outpatient. She will need 1 liter oxygen via nasal cannula, which was set up which was set up by care  management.   Total time discharging this patient was 55 minutes.    ____________________________ Lucina Mellow. Manuella Ghazi, MD vss:dm D: 10/26/2012 11:44:00 ET T: 10/26/2012 12:45:52 ET JOB#: [id number]  cc: Dianey Suchy S. Manuella Ghazi, MD, <Dictator> Ellamae Sia, MD [place] Lilian Kapur, MD [place] E. Raul Del, MD Terrilyn Saver MD  ([facility]/Oncology) Lew Dawes. Genevive Bi, MD [place] Grayland Ormond, MD Efraim Kaufmann, MD [place] MD ELECTRONICALLY SIGNED 10/27/2012 15:26

## 2014-09-09 NOTE — Consult Note (Signed)
PATIENT NAME:  Paula Stone, GAMMON MR#:  [id number] DATE OF BIRTH:  11-19-1945  CARDIOLOGY CONSULTATION REPORT  DATE OF CONSULTATION:  10/05/2012  CONSULTING PHYSICIAN:  Dr. Randol Kern.   REASON FOR CONSULTATION: Acute congestive heart failure, chronic kidney disease, hypertension, hyperlipidemia, with elevated troponin.   CHIEF COMPLAINT: "I'm generalized weak."   HISTORY OF PRESENT ILLNESS: This 69 year old female with known of chronic kidney disease, on dialysis 3 times per week for which she has done fairly well until recently when she had significant shortness of breath, weakness and generalized aches and pains, for which she was seen in the Emergency Room at that time. She had pulmonary edema by chest x-ray, as well as some shortness of breath and hypoxia, with a BNP of 74,677. The patient also has had significant intermittent elevations of troponin, consistent with elevated troponin of 0.25, more consistent with demand ischemia and hypoxia rather than acute coronary syndrome. The patient also has had anemia which is exacerbating her congestive heart failure-type symptoms. Blood pressure has been reasonably well-controlled with current medical regimen. The patient's EKG has shown normal sinus rhythm, normal EKG.   REVIEW OF SYSTEMS: The remainder review of systems negative for vision change, ringing in the ears, hearing loss, cough, congestion, heartburn, nausea, vomiting, diarrhea, bloody stools, stomach pain, extremity pain, leg weakness, cramping of the buttocks, known blood clots, headaches, blackouts, dizzy spells, nosebleeds, congestion, trouble swallowing, frequent urination, urination at night, muscle weakness, numbness, anxiety, depression, skin lesions, or skin rashes.   PAST MEDICAL HISTORY: 1.  Chronic kidney disease.  2.  Hypertension.  3.  Anemia.   FAMILY HISTORY: No family members with early onset of cardiovascular disease or hypertension.   SOCIAL HISTORY: Currently denies  alcohol or tobacco use.   ALLERGIES: SHE HAS ALLERGIES TO MEDICATIONS AS LISTED.   MEDICATIONS: As listed.   PHYSICAL EXAMINATION: VITAL SIGNS: Blood pressure is 146/62 bilaterally, heart rate 70 upright, reclining, and regular.  GENERAL: She is a well-appearing female in no acute distress.  HEENT: No icterus, thyromegaly, ulcers, hemorrhage, or xanthelasma.  CARDIOVASCULAR: Regular rate and rhythm. Normal S1 and S2; 2/6 apical murmur, consistent with mitral regurgitation. PMI is diffuse. Carotid upstrokes normal, without bruit. Jugular venous pressure is normal.  LUNGS: Lungs have bibasilar crackles, with normal respirations.  ABDOMEN: Soft, nontender, without hepatosplenomegaly or masses. Abdominal aorta is normal size, without bruit.  EXTREMITIES: Show 2+ radial, femoral, dorsal pedal pulses, with 1 to 2+ lower extremity edema. No cyanosis, clubbing, or ulcers.  NEUROLOGIC: She is oriented to time, place, and person, with a normal mood and affect.   ASSESSMENT: A 69 year old female with known chronic kidney disease, anemia, hypertension, with acute congestive heart failure, unknown whether systolic or diastolic at this point, with elevated BNP and elevated troponin more consistent with demand ischemia rather than acute coronary syndrome.   RECOMMENDATIONS: 1. Echocardiogram for further evaluation of acute systolic versus diastolic congestive heart failure.  2.  Continue dialysis for improvements of heart failure.  3. Consideration of treatment of generalized anemia which will likely cause symptoms listed above.  4.  No change in current medical regimen for hypertension control, which appears to be relatively stable at this time.  5. Continue serial electrocardiograms and enzymes to assess extent of elevated troponins and further adjustments of medications as necessary.  6.  Further treatment options after.    ____________________________ Lamar Blinks, MD bjk:dm D: 10/05/2012  09:02:34 ET T: 10/05/2012 11:39:03 ET JOB#: [id number]  cc: Lamar Blinks,  MD, <Dictator> Lamar BlinksBRUCE J Embree Brawley MD ELECTRONICALLY SIGNED 10/07/2012 14:11

## 2014-09-09 NOTE — H&P (Signed)
PATIENT NAME:  Paula Stone, TREMAIN MR#:  [id number] DATE OF BIRTH:  Jul 26, 1945  DATE OF ADMISSION:  06/21/2012  PRIMARY CARE PHYSICIAN: Ellin Goodie.  PRIMARY NEPHROLOGIST: Dr. Candiss Norse.   CHIEF COMPLAINT: Shortness of breath of 1 day with congestion in the chest and wheezing.   HISTORY OF PRESENTING ILLNESS: A 69 year old female patient with history of multiple myeloma, end-stage renal disease on hemodialysis, presents to the Emergency Room complaining of 1 day of shortness of breath, congestion of the chest and dry cough. The patient was found to have elevated troponin of 0.11, elevated BNP, fever of 100 and chest x-ray suggesting possible pulmonary edema, admitted to the hospitalist service for further workup and treatment. The patient did have her dialysis yesterday. She gets it 3 times a week. Follows with Dr. Candiss Norse. Also, her blood pressure has been elevated at 200/87, not trending down in spite of treatment.   The patient did have an influenza test which was negative. She did receive influenza vaccine. No infiltrates on chest x-ray. No fever over 100 .4.   PAST MEDICAL HISTORY: 1.  Multiple myeloma.  2.  End-stage renal disease, on hemodialysis.  3.  Hypertension.  4.  Gout.  5.  Diabetes mellitus.  6.  Degenerative joint disease.   PAST SURGICAL HISTORY:  Left arm AV shunt placement.   FAMILY HISTORY: Negative and reviewed.   SOCIAL HISTORY: The patient does not smoke. No alcohol, no illicit drugs. Lives alone.   CODE STATUS: Full code.   HOME MEDICATIONS:  Include:  1.  Oxycodone 10 mg 1 to 2 tablets orally every 6 hours as needed for pain.  2.  Compazine 10 mg 1 tablet orally every 6 hours as needed for nausea.  3.  Dexamethasone 4 mg tablets, 10 tablets oral once a week.   REVIEW OF SYSTEMS:  CONSTITUTIONAL:  Complains of fatigue. No weight loss, weight gain.  ENT: No nasal congestion, tinnitus, hearing loss.  EYES: No discharge, blurring or redness.  CARDIOVASCULAR: No  chest pain, orthopnea, edema.  RESPIRATORY: Has wheezing and shortness of breath.  GASTROINTESTINAL: No nausea, vomiting, diarrhea, abdominal pain.  GENITOURINARY: Makes minimal urine, is on hemodialysis.  SKIN:  No petechiae, rash.  MUSCULOSKELETAL: No arthritis.  NEUROLOGICAL: No focal motor weakness or sensory loss.   ALLERGIES: No known drug allergies.   PHYSICAL EXAMINATION: VITAL SIGNS: Temperature 100, blood pressure 200/87, presently 189/92, saturating 95% on room air, breathing 22 per minute.  GENERAL: Obese, African American female patient lying in bed, comfortable, conversational, cooperative with exam.  PSYCHIATRIC: Alert and oriented x 3. Mood and affect appropriate. Judgment intact.  HEENT: Atraumatic, normocephalic. Oral mucosa moist and pink. External nose normal. No pallor. No icterus. Pupils bilaterally equal and reactive to light.  NECK: Supple. No thyromegaly. No palpable lymph nodes. Trachea midline. No carotid bruit or JVD.  CARDIOVASCULAR: S1 and S2, peripheral pulses 2+, no edema.  RESPIRATORY: Increased work of breathing, using accessory muscles, bilateral wheezing, decreased air entry at the bases.  MUSCULOSKELETAL: No joint swelling, redness or effusion of the large joints. Normal muscle tone.  NEUROLOGICAL: Motor strength 5 out of 5 in upper and lower extremities. Sensation tofine touch intact all over.  LYMPHATIC: No cervical lymphadenopathy.   LABORATORY, DIAGNOSTIC AND RADIOLOGICAL DATA:  1.  Lab studies show glucose 85. BNP 16,459. BUN 29, creatinine 5.52, potassium of 3.3. Troponin 0.11. CK 75. WBC 8.6, hemoglobin 12.2 with platelet count 74, neutrophils 77%.  2.  Influenza test negative.  3.  Chest x-ray shows pulmonary edema versus pneumonitis.   ASSESSMENT AND PLAN: 1.  Acute bronchitis with possibility of pneumonitis in a patient with multiple myeloma. Will start the patient on broad-spectrum antibiotics with meropenem at this time, get blood and  sputum cultures. We will also put her on nebulizers for the wheezing and steroids.  2.  Malignant hypertension. The patient was on lisinopril in the past. Her recent visit with Dr. Grayland Ormond note was reviewed and shows elevated blood pressure at 167. We will start her on lisinopril and Norvasc and follow. Also use IV medications as needed. The patient will need a stat labetalol dose at this time to control her blood pressure which is at 608 systolic.  3.  Elevated troponin likely secondary to acute respiratory failure in the setting of end-stage renal disease. We will trend her cardiac enzymes. No chest pain.  4.  End stage renal on hemodialysis. Discussed the case with Dr. Candiss Norse, who will see the patient. There is some mild pulmonary edema on the chest x-ray which could also be just chronic changes. Will see if the patient can be fit into her dialysis schedule in the morning.  5.  Thrombocytopenia, likely secondary from multiple myeloma. Needs to be followed, seems stable.  6.  Deep vein prophylaxis with heparin.  7.  CODE STATUS: Full code.   TIME SPENT: Time spent today on this case was 55 minutes.   ____________________________ Leia Alf Ranisha Allaire, MD srs:cs D: 06/21/2012 13:54:00 ET T: 06/21/2012 20:54:35 ET JOB#: [id number]  cc: Ellamae Sia, MD Ellie Spickler R. Bennett Vanscyoc, MD, <Dictator> Dr. Roosevelt Locks MD ELECTRONICALLY SIGNED 06/25/2012 13:18

## 2014-09-09 NOTE — Consult Note (Signed)
PATIENT NAME:  Paula Stone, Paula Stone MR#:  [id number] DATE OF BIRTH:  1945-08-16  DATE OF CONSULTATION:  10/19/2012  CONSULTING PHYSICIAN:  Alaster Asfaw E. Thelma Bargeaks, MD  REQUESTING PHYSICIAN:  Dr. Delfino LovettVipul Shah  REASON FOR CONSULT:  Right pleural effusion.   I have personally seen and examined Paula Stone. I discussed her care with Dr. Sherryll BurgerShah. I have independently reviewed her films.   REPORT OF CONSULTATION:  Paula Stone is a 69 year old African-American woman with a history of end-stage renal disease, currently on dialysis. She was brought into the hospital several days ago with complaints of shortness of breath, and was found to have a right-sided pleural effusion. The patient has a history of thrombocytopenia, and she was treated for that, and was scheduled to undergo a right-sided ultrasound-guided thoracentesis. She had a CT scan made today, which reveals a very large right-sided pleural effusion. The right lung is essentially collapsed. I do not appreciate any pleural nodularity or pleural thickening to suggest an underlying malignancy. The patient states that she has been feeling slightly better since she has been admitted to the hospital, and is less short of breath now than she has been.   PHYSICAL EXAMINATION: LUNGS:  Markedly diminished on the right.  HEART:  Regular. Her left side is clear.  ABDOMEN:  Soft and nontender.  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 There is a good and active bruit present.   IMPRESSION AND PLAN:  I have independently reviewed the patient's CT scan. There does appear to be a significant pleural effusion on the right. I would recommend that we attempt an image- guided drainage procedure. If at all possible, I would send the fluid for both cytology, culture, as well as routine laboratory investigations. In addition, I would make certain that we send the fluid for aerobic, anaerobic and mycobacterial cultures.   At the present time, I do not see  any urgent need for surgical intervention. Dr. Duwaine MaxinWilliam Marterre will be covering over the course of this week. Any questions can be directed towards him.   Thank you very much for allowing me to participate in her care.   ____________________________ Sheppard Plumberimothy E. Thelma Bargeaks, MD teo:mr D: 10/19/2012 16:52:00 ET T: 10/19/2012 19:29:01 ET JOB#: [id number]  cc: Marcial Pacasimothy E. Thelma Bargeaks, MD, <Dictator> Jasmine DecemberIMOTHY E Synia Douglass MD ELECTRONICALLY SIGNED 11/02/2012 11:25
